# Patient Record
Sex: Female | Born: 1935 | Race: White | Hispanic: No | Smoking: Never smoker
Health system: Southern US, Community
[De-identification: ages and names within clinical notes are randomized; demographics above are authoritative.]

## PROBLEM LIST (undated history)

## (undated) DIAGNOSIS — I2510 Atherosclerotic heart disease of native coronary artery without angina pectoris: Secondary | ICD-10-CM

## (undated) DIAGNOSIS — F0390 Unspecified dementia without behavioral disturbance: Secondary | ICD-10-CM

## (undated) DIAGNOSIS — F32A Depression, unspecified: Secondary | ICD-10-CM

## (undated) DIAGNOSIS — E119 Type 2 diabetes mellitus without complications: Secondary | ICD-10-CM

## (undated) DIAGNOSIS — I739 Peripheral vascular disease, unspecified: Secondary | ICD-10-CM

## (undated) DIAGNOSIS — F329 Major depressive disorder, single episode, unspecified: Secondary | ICD-10-CM

## (undated) DIAGNOSIS — E785 Hyperlipidemia, unspecified: Secondary | ICD-10-CM

## (undated) HISTORY — PX: PERCUTANEOUS CORONARY STENT INTERVENTION (PCI-S): SHX6016

---

## 1988-01-28 HISTORY — PX: EXPLORATORY LAPAROTOMY: SUR591

## 2015-05-15 ENCOUNTER — Encounter: Payer: Self-pay | Admitting: *Deleted

## 2015-05-15 ENCOUNTER — Emergency Department
Admission: EM | Admit: 2015-05-15 | Discharge: 2015-05-15 | Disposition: A | Discharge status: Home / Self Care | Payer: Medicare Other | Attending: Emergency Medicine | Admitting: Emergency Medicine

## 2015-05-15 ENCOUNTER — Emergency Department: Payer: Medicare Other

## 2015-05-15 DIAGNOSIS — Y939 Activity, unspecified: Secondary | ICD-10-CM | POA: Insufficient documentation

## 2015-05-15 DIAGNOSIS — W19XXXA Unspecified fall, initial encounter: Secondary | ICD-10-CM

## 2015-05-15 DIAGNOSIS — T148XXA Other injury of unspecified body region, initial encounter: Secondary | ICD-10-CM

## 2015-05-15 DIAGNOSIS — S0990XA Unspecified injury of head, initial encounter: Secondary | ICD-10-CM | POA: Diagnosis present

## 2015-05-15 DIAGNOSIS — Y9289 Other specified places as the place of occurrence of the external cause: Secondary | ICD-10-CM | POA: Insufficient documentation

## 2015-05-15 DIAGNOSIS — Y999 Unspecified external cause status: Secondary | ICD-10-CM | POA: Diagnosis not present

## 2015-05-15 DIAGNOSIS — W01198A Fall on same level from slipping, tripping and stumbling with subsequent striking against other object, initial encounter: Secondary | ICD-10-CM | POA: Diagnosis not present

## 2015-05-15 DIAGNOSIS — S0083XA Contusion of other part of head, initial encounter: Secondary | ICD-10-CM | POA: Insufficient documentation

## 2015-05-15 NOTE — ED Provider Notes (Addendum)
[facility] Emergency Department Provider Note  Time seen: 9:48 PM  I have reviewed the triage vital signs and the nursing notes.   HISTORY  Chief Complaint Fall and Head Injury    HPI Kelli Valenzuela is a 82 y.o. female with a past medical history of diabetes, hypertension, dementia, who [facility]. According to the patient she states she fell forward hitting her head. Denies LOC. Patient has a large hematoma to right forehead. States mild headache but denies any other complaints. Patient does have a history of dementia which limits the history.Patient is on Plavix per record review.     No past medical history on file.  There are no active problems to display for this patient.   No past surgical history on file.  No current outpatient prescriptions on file.  Allergies Review of patient's allergies indicates no known allergies.  No family history on file.  Social History Social History  Substance Use Topics  . Smoking status: Never Smoker   . Smokeless tobacco: Not on file  . Alcohol Use: No    Review of Systems Constitutional: Negative for fever. Cardiovascular: Negative for chest pain. Respiratory: Negative for shortness of breath. Gastrointestinal: Negative for abdominal pain Musculoskeletal: Negative for back pain.Negative for arm or leg pain negative for neck pain Skin: Hematoma/ecchymosis to right forehead Neurological: Slight headache. Denies weakness or numbness. 10-point ROS otherwise negative.  ____________________________________________   PHYSICAL EXAM:  VITAL SIGNS: ED Triage Vitals  Enc Vitals Group     BP 05/15/15 1945 180/83 mmHg     Pulse Rate 05/15/15 1945 66     Resp 05/15/15 1945 20     Temp 05/15/15 1945 98.2 F (36.8 C)     Temp Source 05/15/15 1945 Oral     SpO2 05/15/15 1945 99 %     Weight 05/15/15 1945 140 lb (63.504 kg)     Height 05/15/15 1945 5'  3" (1.6 m)     Head Cir --      Peak Flow --      Pain Score 05/15/15 2014 0     Pain Loc --      Pain Edu? --      Excl. in GC? --     Constitutional: Alert.  Well appearing and in no distress. Eyes: Normal exam ENT   Head:Moderate size hematoma to right forehead. No laceration. Neck/C-spine is nontender to palpation.   Mouth/Throat: Mucous membranes are moist. Cardiovascular: Normal rate, regular rhythm.  Respiratory: Normal respiratory effort without tachypnea nor retractions. Breath sounds are clear Gastrointestinal: Soft and nontender. No distention. Musculoskeletal: Nontender with normal range of motion in all extremities. Good range of motion in all extremities.  Neurologic:  Normal speech and language. No gross focal neurologic deficits Skin:  Skin is warm, dry and intact. Hematoma to the right forehead as noted above. Psychiatric: Mood and affect are normal. Speech and behavior are normal.   ____________________________________________   RADIOLOGY  CT scan shows no acute abnormality.  ____________________________________________    INITIAL IMPRESSION / ASSESSMENT AND PLAN / ED COURSE  Pertinent labs & imaging results that were available during my care of the patient were reviewed by me and considered in my medical decision making (see chart for details).  Patient presents after a fall at her nursing facility. Moderate sized hematoma to right forehead. CT is negative for intracranial abnormality or cervical fracture. Patient's daughter is here with the  patient, patient is calm, pleasant, no complaints at this time. We'll discharge the patient home and to the daughter's care.  ____________________________________________   FINAL CLINICAL IMPRESSION(S) / ED DIAGNOSES  Fall Closed head Injury   Minna Antis, MD 05/15/15 2152  Minna Antis, MD 05/15/15 2153

## 2015-05-15 NOTE — ED Notes (Signed)
Pt to bathroom with NT

## 2015-05-15 NOTE — Discharge Instructions (Signed)
Fall Prevention in Hospitals, Adult °As a hospital patient, your condition and the treatments you receive can increase your risk for falls. Some additional risk factors for falls in a hospital include: °· Being in an unfamiliar environment. °· Being on bed rest. °· Your surgery. °· Taking certain medicines. °· Your tubing requirements, such as intravenous (IV) therapy or catheters. °It is important that you learn how to decrease fall risks while at the hospital. Below are important tips that can help prevent falls. °SAFETY TIPS FOR PREVENTING FALLS °Talk about your risk of falling. °· Ask your health care provider why you are at risk for falling. Is it your medicine, illness, tubing placement, or something else? °· Make a plan with your health care provider to keep you safe from falls. °· Ask your health care provider or pharmacist about side effects of your medicines. Some medicines can make you dizzy or affect your coordination. °Ask for help. °· Ask for help before getting out of bed. You may need to press your call button. °· Ask for assistance in getting safely to the toilet. °· Ask for a walker or cane to be put at your bedside. Ask that most of the side rails on your bed be placed up before your health care provider leaves the room. °· Ask family or friends to sit with you. °· Ask for things that are out of your reach, such as your glasses, hearing aids, telephone, bedside table, or call button. °Follow these tips to avoid falling: °· Stay lying or seated, rather than standing, while waiting for help. °· Wear rubber-soled slippers or shoes whenever you walk in the hospital. °· Avoid quick, sudden movements. °· Change positions slowly. °· Sit on the side of your bed before standing. °· Stand up slowly and wait before you start to walk. °· Let your health care provider know if there is a spill on the floor. °· Pay careful attention to the medical equipment, electrical cords, and tubes around you. °· When you  need help, use your call button by your bed or in the bathroom. Wait for one of your health care providers to help you. °· If you feel dizzy or unsure of your footing, return to bed and wait for assistance. °· Avoid being distracted by the TV, telephone, or another person in your room. °· Do not lean or support yourself on rolling objects, such as IV poles or bedside tables. °  °This information is not intended to replace advice given to you by your health care provider. Make sure you discuss any questions you have with your health care provider. °  °Document Released: 01/11/2000 Document Revised: 02/03/2014 Document Reviewed: 09/21/2011 °Elsevier Interactive Patient Education ©2016 [employer] ° °Head Injury, Adult °You have a head injury. Headaches and throwing up (vomiting) are common after a head injury. It should be easy to wake up from sleeping. Sometimes you must stay in the hospital. Most problems happen within the first 24 hours. Side effects may occur up to 7-10 days after the injury.  °WHAT ARE THE TYPES OF HEAD INJURIES? °Head injuries can be as minor as a bump. Some head injuries can be more severe. More severe head injuries include: °· A jarring injury to the brain (concussion). °· A bruise of the brain (contusion). This mean there is bleeding in the brain that can cause swelling. °· A cracked skull (skull fracture). °· Bleeding in the brain that collects, clots, and forms a bump (hematoma). °WHEN SHOULD I   GET HELP RIGHT AWAY?  °· You are confused or sleepy. °· You cannot be woken up. °· You feel sick to your stomach (nauseous) or keep throwing up (vomiting). °· Your dizziness or unsteadiness is getting worse. °· You have very bad, lasting headaches that are not helped by medicine. Take medicines only as told by your doctor. °· You cannot use your arms or legs like normal. °· You cannot walk. °· You notice changes in the black spots in the center of the colored part of your eye (pupil). °· You have  clear or bloody fluid coming from your nose or ears. °· You have trouble seeing. °During the next 24 hours after the injury, you must stay with someone who can watch you. This person should get help right away (call 911 in the [place] if you start to shake and are not able to control it (have seizures), you pass out, or you are unable to wake up. °HOW CAN I PREVENT A HEAD INJURY IN THE FUTURE? °· Wear seat belts. °· Wear a helmet while bike riding and playing sports like football. °· Stay away from dangerous activities around the house. °WHEN CAN I RETURN TO NORMAL ACTIVITIES AND ATHLETICS? °See your doctor before doing these activities. You should not do normal activities or play contact sports until 1 week after the following symptoms have stopped: °· Headache that does not go away. °· Dizziness. °· Poor attention. °· Confusion. °· Memory problems. °· Sickness to your stomach or throwing up. °· Tiredness. °· Fussiness. °· Bothered by bright lights or loud noises. °· Anxiousness or depression. °· Restless sleep. °MAKE SURE YOU:  °· Understand these instructions. °· Will watch your condition. °· Will get help right away if you are not doing well or get worse. °  °This information is not intended to replace advice given to you by your health care provider. Make sure you discuss any questions you have with your health care provider. °  °Document Released: 12/27/2007 Document Revised: 02/03/2014 Document Reviewed: 09/20/2012 °Elsevier Interactive Patient Education ©2016 Elsevier Inc. ° °

## 2015-05-15 NOTE — ED Notes (Signed)
[facility].  Pt had unwitnessed fall today.  Struck unknown object.  Hematoma to right side of forehead.  Unsure loc.   Pt is on blood thinner.  Pt alert.  Hx dementia.

## 2015-08-08 ENCOUNTER — Emergency Department: Payer: Medicare Other

## 2015-08-08 ENCOUNTER — Emergency Department
Admission: EM | Admit: 2015-08-08 | Discharge: 2015-08-08 | Disposition: A | Discharge status: Home / Self Care | Payer: Medicare Other | Attending: Emergency Medicine | Admitting: Emergency Medicine

## 2015-08-08 DIAGNOSIS — E119 Type 2 diabetes mellitus without complications: Secondary | ICD-10-CM | POA: Diagnosis not present

## 2015-08-08 DIAGNOSIS — I2510 Atherosclerotic heart disease of native coronary artery without angina pectoris: Secondary | ICD-10-CM | POA: Insufficient documentation

## 2015-08-08 DIAGNOSIS — W182XXA Fall in (into) shower or empty bathtub, initial encounter: Secondary | ICD-10-CM | POA: Diagnosis not present

## 2015-08-08 DIAGNOSIS — Y939 Activity, unspecified: Secondary | ICD-10-CM | POA: Diagnosis not present

## 2015-08-08 DIAGNOSIS — Y929 Unspecified place or not applicable: Secondary | ICD-10-CM | POA: Insufficient documentation

## 2015-08-08 DIAGNOSIS — F329 Major depressive disorder, single episode, unspecified: Secondary | ICD-10-CM | POA: Diagnosis not present

## 2015-08-08 DIAGNOSIS — S0990XA Unspecified injury of head, initial encounter: Secondary | ICD-10-CM | POA: Diagnosis present

## 2015-08-08 DIAGNOSIS — Y999 Unspecified external cause status: Secondary | ICD-10-CM | POA: Diagnosis not present

## 2015-08-08 DIAGNOSIS — E785 Hyperlipidemia, unspecified: Secondary | ICD-10-CM | POA: Diagnosis not present

## 2015-08-08 HISTORY — DX: Atherosclerotic heart disease of native coronary artery without angina pectoris: I25.10

## 2015-08-08 HISTORY — DX: Unspecified dementia, unspecified severity, without behavioral disturbance, psychotic disturbance, mood disturbance, and anxiety: F03.90

## 2015-08-08 HISTORY — DX: Major depressive disorder, single episode, unspecified: F32.9

## 2015-08-08 HISTORY — DX: Hyperlipidemia, unspecified: E78.5

## 2015-08-08 HISTORY — DX: Depression, unspecified: F32.A

## 2015-08-08 HISTORY — DX: Type 2 diabetes mellitus without complications: E11.9

## 2015-08-08 NOTE — ED Notes (Signed)
Pt from [facility] via EMS, reports unwitnessed fall in the shower, pt presents with smal hematoma to back of head, pt denies pain and is at her baseline

## 2015-08-08 NOTE — ED Notes (Signed)
Patient transported to X-ray 

## 2015-08-08 NOTE — ED Provider Notes (Signed)
Time Seen: Approximately *20/30 I have reviewed the triage notes  Chief Complaint: Fall   History of Present Illness: Kelli Valenzuela is a 82 y.o. female who presents after a non-syncopal fall in the shower. Patient apparently lost her balance and slipped and landed primarily on her head and her chest. She states she feels fine. She denies any neck, thoracic, lumbar spine pain. She denies any extremity pain. Said she has some mild upper left chest wall pain which occurred after the fall. She denies any syncope or loss of consciousness after a fall.   Past Medical History  Diagnosis Date  . Diabetes mellitus without complication (HCC)   . Coronary artery disease   . Hyperlipidemia   . Depression   . Dementia     There are no active problems to display for this patient.   No past surgical history on file.  No past surgical history on file.  No current outpatient prescriptions on file.  Allergies:  Review of patient's allergies indicates no known allergies.  Family History: No family history on file.  Social History: Social History  Substance Use Topics  . Smoking status: Never Smoker   . Smokeless tobacco: None  . Alcohol Use: No     Review of Systems:   10 point review of systems was performed and was otherwise negative:  Constitutional: No fever Eyes: No visual disturbances ENT: No sore throat, ear pain Cardiac: Mild upper left chest wall pain Respiratory: No shortness of breath, wheezing, or stridor Abdomen: No abdominal pain, no vomiting, No diarrhea Endocrine: No weight loss, No night sweats Extremities: No peripheral edema, cyanosis Skin: No rashes, easy bruising Neurologic: No focal weakness, trouble with speech or swollowing Urologic: No dysuria, Hematuria, or urinary frequency   Physical Exam:  ED Triage Vitals  Enc Vitals Group     BP 08/08/15 1916 123/71 mmHg     Pulse Rate 08/08/15 1916 71     Resp 08/08/15 1916 16     Temp 08/08/15 1916  98.5 F (36.9 C)     Temp src --      SpO2 08/08/15 1916 96 %     Weight --      Height --      Head Cir --      Peak Flow --      Pain Score --      Pain Loc --      Pain Edu? --      Excl. in GC? --     General: Awake , Alert , and Oriented times 3; GCS 15 Head: Normal cephalic , Small hematoma left frontal region. No crepitus or step-off noted Eyes: Pupils equal , round, reactive to light Nose/Throat: No nasal drainage, patent upper airway without erythema or exudate.  Neck: Supple, Full range of motion, No anterior adenopathy or palpable thyroid masses. Good flexion extension or rotation pain or neuropraxia. Lungs: Clear to ascultation without wheezes , rhonchi, or rales Heart: Regular rate, regular rhythm without murmurs , gallops , or rubs Abdomen: Soft, non tender without rebound, guarding , or rigidity; bowel sounds positive and symmetric in all 4 quadrants. No organomegaly .        Extremities: 2 plus symmetric pulses. No edema, clubbing or cyanosis. Full range of motion with no obvious acute trauma. She has a small bruise on the left hand posteriorly she states is old. Neurologic: n, Motor symmetric without deficits, sensory intact Skin: warm, dry, no rashes No reproducible thoracic  or lumbar spine pain.  EKG: * ED ECG REPORT I, Jennye Moccasin, the attending physician, personally viewed and interpreted this ECG.  Date: 08/08/2015 EKG Time: *1953 Rate: 75 Rhythm: normal sinus rhythm QRS Axis: normal Intervals: normal ST/T Wave abnormalities: normal Conduction Disturbances: none Narrative Interpretation: unremarkable Poor R-wave progression anteriorly leads No acute ischemic changes   Radiology:       CT HEAD WO CONTRAST (Final result) Result time: 08/08/15 21:05:10   Final result by Rad Results In Interface (08/08/15 21:05:10)   Narrative:   CLINICAL DATA: Unwitnessed fall in the shower. Hematoma of the back of the head.  EXAM: CT HEAD WITHOUT  CONTRAST  TECHNIQUE: Contiguous axial images were obtained from the base of the skull through the vertex without intravenous contrast.  COMPARISON: 05/15/2015  FINDINGS: Left posterior parietal scalp swelling. No underlying skull fracture. No evidence of intracranial hemorrhage. The brain shows mild generalized atrophy with mild chronic small-vessel disease of the white matter, typical for age. No mass lesion, hydrocephalus or extra-axial collection. Sinuses, middle ears and mastoids are clear. There is atherosclerotic calcification of the major vessels at the base of the brain.  IMPRESSION: No skull fracture. No acute intracranial finding. Ordinary age related atrophy and chronic small vessel disease. Left parietal scalp hematoma.   Electronically Signed By: Paulina Fusi M.D. On: 08/08/2015 21:05          DG Chest 2 View (Final result) Result time: 08/08/15 20:37:41   Final result by Rad Results In Interface (08/08/15 20:37:41)   Narrative:   CLINICAL DATA: Status post fall, trauma  EXAM: CHEST 2 VIEW  COMPARISON: None.  FINDINGS: There is no focal parenchymal opacity. There is no pleural effusion or pneumothorax. The heart and mediastinal contours are unremarkable. There is evidence of prior CABG.  There is left shoulder arthroplasty there is no acute osseous abnormality  IMPRESSION: No active cardiopulmonary disease.   Electronically Signed By: Elige Ko On: 08/08/2015 20:37          I personally reviewed the radiologic studies     ED Course: * Patient's stay here was uneventful and with her head trauma she is at her normal mental status. Patient does not appear to have suffered any significant intracranial injury. She is otherwise stable with no obvious changes in her EKG. Her chest wall pain essentially resolved at this time and she has no crepitus or signs of a pneumothorax. Patient I felt could be discharged back to  assisted living area. She will be discharged there and will be transported by her family.    Assessment:  Acute closed head injury   Final Clinical Impression:  Final diagnoses:  Head trauma, initial encounter     Plan:  Outpatient Please return immediately if condition worsens. Please contact her primary physician or the physician you were given for referral. If you have any specialist physicians involved in her treatment and plan please also contact them. Thank you for using [facility].            Jennye Moccasin, MD 08/08/15 2146

## 2015-08-08 NOTE — Discharge Instructions (Signed)
Head Injury, Adult °You have a head injury. Headaches and throwing up (vomiting) are common after a head injury. It should be easy to wake up from sleeping. Sometimes you must stay in the hospital. Most problems happen within the first 24 hours. Side effects may occur up to 7-10 days after the injury.  °WHAT ARE THE TYPES OF HEAD INJURIES? °Head injuries can be as minor as a bump. Some head injuries can be more severe. More severe head injuries include: °· A jarring injury to the brain (concussion). °· A bruise of the brain (contusion). This mean there is bleeding in the brain that can cause swelling. °· A cracked skull (skull fracture). °· Bleeding in the brain that collects, clots, and forms a bump (hematoma). °WHEN SHOULD I GET HELP RIGHT AWAY?  °· You are confused or sleepy. °· You cannot be woken up. °· You feel sick to your stomach (nauseous) or keep throwing up (vomiting). °· Your dizziness or unsteadiness is getting worse. °· You have very bad, lasting headaches that are not helped by medicine. Take medicines only as told by your doctor. °· You cannot use your arms or legs like normal. °· You cannot walk. °· You notice changes in the black spots in the center of the colored part of your eye (pupil). °· You have clear or bloody fluid coming from your nose or ears. °· You have trouble seeing. °During the next 24 hours after the injury, you must stay with someone who can watch you. This person should get help right away (call 911 in the [place] if you start to shake and are not able to control it (have seizures), you pass out, or you are unable to wake up. °HOW CAN I PREVENT A HEAD INJURY IN THE FUTURE? °· Wear seat belts. °· Wear a helmet while bike riding and playing sports like football. °· Stay away from dangerous activities around the house. °WHEN CAN I RETURN TO NORMAL ACTIVITIES AND ATHLETICS? °See your doctor before doing these activities. You should not do normal activities or play contact sports until 1  week after the following symptoms have stopped: °· Headache that does not go away. °· Dizziness. °· Poor attention. °· Confusion. °· Memory problems. °· Sickness to your stomach or throwing up. °· Tiredness. °· Fussiness. °· Bothered by bright lights or loud noises. °· Anxiousness or depression. °· Restless sleep. °MAKE SURE YOU:  °· Understand these instructions. °· Will watch your condition. °· Will get help right away if you are not doing well or get worse. °  °This information is not intended to replace advice given to you by your health care provider. Make sure you discuss any questions you have with your health care provider. °  °Document Released: 12/27/2007 Document Revised: 02/03/2014 Document Reviewed: 09/20/2012 °Elsevier Interactive Patient Education ©2016 [employer] Inc. ° °Please return immediately if condition worsens. Please contact her primary physician or the physician you were given for referral. If you have any specialist physicians involved in her treatment and plan please also contact them. Thank you for using [facility]. ° °

## 2015-09-07 ENCOUNTER — Observation Stay
Admission: EM | Admit: 2015-09-07 | Discharge: 2015-09-09 | Disposition: A | Discharge status: Skilled Nursing Facility | Payer: Medicare Other | Source: Home / Self Care | Attending: Internal Medicine | Admitting: Internal Medicine

## 2015-09-07 ENCOUNTER — Encounter: Payer: Self-pay | Admitting: Emergency Medicine

## 2015-09-07 DIAGNOSIS — Z8379 Family history of other diseases of the digestive system: Secondary | ICD-10-CM

## 2015-09-07 DIAGNOSIS — F0390 Unspecified dementia without behavioral disturbance: Secondary | ICD-10-CM

## 2015-09-07 DIAGNOSIS — F329 Major depressive disorder, single episode, unspecified: Secondary | ICD-10-CM | POA: Insufficient documentation

## 2015-09-07 DIAGNOSIS — Z8041 Family history of malignant neoplasm of ovary: Secondary | ICD-10-CM | POA: Insufficient documentation

## 2015-09-07 DIAGNOSIS — E11649 Type 2 diabetes mellitus with hypoglycemia without coma: Secondary | ICD-10-CM

## 2015-09-07 DIAGNOSIS — I2510 Atherosclerotic heart disease of native coronary artery without angina pectoris: Secondary | ICD-10-CM

## 2015-09-07 DIAGNOSIS — T383X5A Adverse effect of insulin and oral hypoglycemic [antidiabetic] drugs, initial encounter: Secondary | ICD-10-CM | POA: Insufficient documentation

## 2015-09-07 DIAGNOSIS — Z794 Long term (current) use of insulin: Secondary | ICD-10-CM | POA: Insufficient documentation

## 2015-09-07 DIAGNOSIS — Z882 Allergy status to sulfonamides status: Secondary | ICD-10-CM

## 2015-09-07 DIAGNOSIS — E162 Hypoglycemia, unspecified: Secondary | ICD-10-CM | POA: Diagnosis present

## 2015-09-07 DIAGNOSIS — I252 Old myocardial infarction: Secondary | ICD-10-CM

## 2015-09-07 DIAGNOSIS — I10 Essential (primary) hypertension: Secondary | ICD-10-CM | POA: Insufficient documentation

## 2015-09-07 DIAGNOSIS — E785 Hyperlipidemia, unspecified: Secondary | ICD-10-CM

## 2015-09-07 DIAGNOSIS — Z79899 Other long term (current) drug therapy: Secondary | ICD-10-CM | POA: Insufficient documentation

## 2015-09-07 LAB — GLUCOSE, CAPILLARY
GLUCOSE-CAPILLARY: 117 mg/dL — AB (ref 65–99)
GLUCOSE-CAPILLARY: 166 mg/dL — AB (ref 65–99)

## 2015-09-07 MED ORDER — DEXTROSE-NACL 5-0.9 % IV SOLN
INTRAVENOUS | Status: DC
  Administered 2015-09-07: 23:00:00 via INTRAVENOUS

## 2015-09-07 NOTE — ED Triage Notes (Addendum)
[facility] to [facility], EMS report pt BG 40 upon arrival, d50 given and BG up to 363.  Per facility, pt's BG normally elevated in 200-300s.  Pt baseline dementia.  Pt NAD upon arrival, clothes were wet from diaphoresis, removed and pt placed in clean gown given warm blanket

## 2015-09-08 ENCOUNTER — Encounter: Payer: Self-pay | Admitting: Internal Medicine

## 2015-09-08 DIAGNOSIS — E162 Hypoglycemia, unspecified: Secondary | ICD-10-CM | POA: Diagnosis present

## 2015-09-08 LAB — GLUCOSE, CAPILLARY
GLUCOSE-CAPILLARY: 143 mg/dL — AB (ref 65–99)
Glucose-Capillary: 123 mg/dL — ABNORMAL HIGH (ref 65–99)
Glucose-Capillary: 127 mg/dL — ABNORMAL HIGH (ref 65–99)
Glucose-Capillary: 164 mg/dL — ABNORMAL HIGH (ref 65–99)
Glucose-Capillary: 180 mg/dL — ABNORMAL HIGH (ref 65–99)
Glucose-Capillary: 186 mg/dL — ABNORMAL HIGH (ref 65–99)
Glucose-Capillary: 188 mg/dL — ABNORMAL HIGH (ref 65–99)
Glucose-Capillary: 189 mg/dL — ABNORMAL HIGH (ref 65–99)
Glucose-Capillary: 218 mg/dL — ABNORMAL HIGH (ref 65–99)
Glucose-Capillary: 231 mg/dL — ABNORMAL HIGH (ref 65–99)
Glucose-Capillary: 59 mg/dL — ABNORMAL LOW (ref 65–99)
Glucose-Capillary: 60 mg/dL — ABNORMAL LOW (ref 65–99)
Glucose-Capillary: 73 mg/dL (ref 65–99)

## 2015-09-08 LAB — CBC
HEMATOCRIT: 36.8 % (ref 35.0–47.0)
HEMOGLOBIN: 12.6 g/dL (ref 12.0–16.0)
MCH: 30.2 pg (ref 26.0–34.0)
MCHC: 34.4 g/dL (ref 32.0–36.0)
MCV: 87.8 fL (ref 80.0–100.0)
Platelets: 234 10*3/uL (ref 150–440)
RBC: 4.19 MIL/uL (ref 3.80–5.20)
RDW: 14.4 % (ref 11.5–14.5)
WBC: 11.4 10*3/uL — ABNORMAL HIGH (ref 3.6–11.0)

## 2015-09-08 LAB — COMPREHENSIVE METABOLIC PANEL
ALBUMIN: 4.1 g/dL (ref 3.5–5.0)
ALK PHOS: 142 U/L — AB (ref 38–126)
ALT: 9 U/L — ABNORMAL LOW (ref 14–54)
ANION GAP: 10 (ref 5–15)
AST: 14 U/L — ABNORMAL LOW (ref 15–41)
BILIRUBIN TOTAL: 0.5 mg/dL (ref 0.3–1.2)
BUN: 33 mg/dL — ABNORMAL HIGH (ref 6–20)
CALCIUM: 9.5 mg/dL (ref 8.9–10.3)
CO2: 28 mmol/L (ref 22–32)
Chloride: 102 mmol/L (ref 101–111)
Creatinine, Ser: 0.89 mg/dL (ref 0.44–1.00)
GFR calc Af Amer: >60 mL/min (ref >60)
GFR, EST NON AFRICAN AMERICAN: 57 mL/min — AB (ref >60)
GLUCOSE: 115 mg/dL — AB (ref 65–99)
POTASSIUM: 4.2 mmol/L (ref 3.5–5.1)
Sodium: 140 mmol/L (ref 135–145)
TOTAL PROTEIN: 7.3 g/dL (ref 6.5–8.1)

## 2015-09-08 LAB — URINALYSIS COMPLETE WITH MICROSCOPIC (ARMC ONLY)
BILIRUBIN URINE: NEGATIVE
Bacteria, UA: NONE SEEN
GLUCOSE, UA: 50 mg/dL — AB
Hgb urine dipstick: NEGATIVE
KETONES UR: NEGATIVE mg/dL
Leukocytes, UA: NEGATIVE
Nitrite: NEGATIVE
PROTEIN: NEGATIVE mg/dL
SPECIFIC GRAVITY, URINE: 1.02 (ref 1.005–1.030)
SQUAMOUS EPITHELIAL / LPF: NONE SEEN
pH: 5 (ref 5.0–8.0)

## 2015-09-08 LAB — MRSA PCR SCREENING: MRSA by PCR: NEGATIVE

## 2015-09-08 LAB — TSH: TSH: 3.748 u[IU]/mL (ref 0.350–4.500)

## 2015-09-08 LAB — HEMOGLOBIN A1C: Hgb A1c MFr Bld: 7.6 % — ABNORMAL HIGH (ref 4.0–6.0)

## 2015-09-08 MED ORDER — ACETAMINOPHEN 325 MG PO TABS: 650.0000 mg | ORAL_TABLET | Freq: Four times a day (QID) | ORAL | Status: DC | PRN

## 2015-09-08 MED ORDER — VITAMIN B-12 1000 MCG PO TABS
1000.0000 ug | ORAL_TABLET | Freq: Every day | ORAL | Status: DC
  Administered 2015-09-08 – 2015-09-09 (×2): 1000 ug via ORAL
  Filled 2015-09-08 (×2): qty 1

## 2015-09-08 MED ORDER — ONDANSETRON HCL 4 MG/2ML IJ SOLN: 4.0000 mg | Freq: Four times a day (QID) | INTRAMUSCULAR | Status: DC | PRN

## 2015-09-08 MED ORDER — DOCUSATE SODIUM 100 MG PO CAPS
100.0000 mg | ORAL_CAPSULE | Freq: Two times a day (BID) | ORAL | Status: DC
  Administered 2015-09-08 – 2015-09-09 (×3): 100 mg via ORAL
  Filled 2015-09-08 (×3): qty 1

## 2015-09-08 MED ORDER — INSULIN GLARGINE 100 UNIT/ML ~~LOC~~ SOLN
8.0000 [IU] | Freq: Every day | SUBCUTANEOUS | Status: DC
  Filled 2015-09-08: qty 0.08

## 2015-09-08 MED ORDER — ENOXAPARIN SODIUM 40 MG/0.4ML ~~LOC~~ SOLN
40.0000 mg | SUBCUTANEOUS | Status: DC
  Administered 2015-09-08: 40 mg via SUBCUTANEOUS
  Filled 2015-09-08: qty 0.4

## 2015-09-08 MED ORDER — MORPHINE SULFATE (PF) 2 MG/ML IV SOLN: 1.0000 mg | INTRAVENOUS | Status: DC | PRN

## 2015-09-08 MED ORDER — ONDANSETRON HCL 4 MG PO TABS: 4.0000 mg | ORAL_TABLET | Freq: Four times a day (QID) | ORAL | Status: DC | PRN

## 2015-09-08 MED ORDER — BUPROPION HCL 100 MG PO TABS
100.0000 mg | ORAL_TABLET | ORAL | Status: DC
  Administered 2015-09-08: 100 mg via ORAL
  Filled 2015-09-08: qty 1

## 2015-09-08 MED ORDER — DEXTROSE 10 % IV SOLN
INTRAVENOUS | Status: DC
  Administered 2015-09-08: via INTRAVENOUS

## 2015-09-08 MED ORDER — CARVEDILOL 6.25 MG PO TABS
6.2500 mg | ORAL_TABLET | Freq: Two times a day (BID) | ORAL | Status: DC
  Administered 2015-09-08 – 2015-09-09 (×3): 6.25 mg via ORAL
  Filled 2015-09-08 (×3): qty 1

## 2015-09-08 MED ORDER — LISINOPRIL 5 MG PO TABS
2.5000 mg | ORAL_TABLET | Freq: Every day | ORAL | Status: DC
  Administered 2015-09-08 – 2015-09-09 (×2): 2.5 mg via ORAL
  Filled 2015-09-08 (×2): qty 1

## 2015-09-08 MED ORDER — CLOPIDOGREL BISULFATE 75 MG PO TABS
75.0000 mg | ORAL_TABLET | Freq: Every day | ORAL | Status: DC
  Administered 2015-09-08 – 2015-09-09 (×2): 75 mg via ORAL
  Filled 2015-09-08 (×2): qty 1

## 2015-09-08 MED ORDER — ACETAMINOPHEN 650 MG RE SUPP: 650.0000 mg | Freq: Four times a day (QID) | RECTAL | Status: DC | PRN

## 2015-09-08 MED ORDER — INSULIN ASPART 100 UNIT/ML ~~LOC~~ SOLN
0.0000 [IU] | Freq: Three times a day (TID) | SUBCUTANEOUS | Status: DC
  Administered 2015-09-08 (×2): 3 [IU] via SUBCUTANEOUS
  Administered 2015-09-08 – 2015-09-09 (×2): 2 [IU] via SUBCUTANEOUS
  Administered 2015-09-09: 5 [IU] via SUBCUTANEOUS
  Filled 2015-09-08: qty 2
  Filled 2015-09-08 (×2): qty 3
  Filled 2015-09-08: qty 2
  Filled 2015-09-08: qty 5

## 2015-09-08 NOTE — H&P (Signed)
Kelli Valenzuela is an 82 y.o. female.   Chief Complaint: Hypoglycemia HPI: The patient with past medical history significant for dementia and diabetes presents to the emergency department via EMS after being found to have blood sugar approximately 40. The patient was diaphoretic but denied pain. She was given D50 en route and blood sugar increased to 363. However, blood sugar continued to dip. Patient was placed on D5 but then had to be increased to D10 which prompted the emergency department staff to call the hospitalist service for admission.  Past Medical History:  Diagnosis Date  . Coronary artery disease   . Dementia   . Depression   . Diabetes mellitus without complication ([place])   . Hyperlipidemia     Prior to Admission medications   Medication Sig Start Date End Date Taking? Authorizing Provider  buPROPion (WELLBUTRIN) 100 MG tablet Take 100 mg by mouth every other day.   Yes Historical Provider, MD  carvedilol (COREG) 6.25 MG tablet Take 6.25 mg by mouth 2 (two) times daily with a meal.   Yes Historical Provider, MD  clopidogrel (PLAVIX) 75 MG tablet Take 75 mg by mouth daily.   Yes Historical Provider, MD  insulin aspart (NOVOLOG) 100 UNIT/ML injection Inject 10 Units into the skin 2 (two) times daily with a meal. If BS less than 80   Yes Historical Provider, MD  insulin glargine (LANTUS) 100 UNIT/ML injection Inject 14 Units into the skin daily. At 0900 if BS less than 70   Yes Historical Provider, MD  lisinopril (PRINIVIL,ZESTRIL) 2.5 MG tablet Take 2.5 mg by mouth daily.   Yes Historical Provider, MD  meloxicam (MOBIC) 7.5 MG tablet Take 7.5 mg by mouth daily.   Yes Historical Provider, MD  metFORMIN (GLUCOPHAGE) 500 MG tablet Take 1,000 mg by mouth daily with breakfast.   Yes Historical Provider, MD  metFORMIN (GLUCOPHAGE) 500 MG tablet Take 500 mg by mouth every evening.   Yes Historical Provider, MD  vitamin B-12 (CYANOCOBALAMIN) 1000 MCG tablet Take 1,000 mcg by mouth daily.    Yes Historical Provider, MD     Family History  Problem Relation Age of Onset  . Ovarian cancer Mother   . Esophageal varices Father    Social History:  reports that she has never smoked. She has never used smokeless tobacco. She reports that she does not drink alcohol. Her drug history is not on file.  Allergies:  Allergies  Allergen Reactions  . Sulfa Antibiotics Hives    Medications Prior to Admission  Medication Sig Dispense Refill  . buPROPion (WELLBUTRIN) 100 MG tablet Take 100 mg by mouth every other day.    . carvedilol (COREG) 6.25 MG tablet Take 6.25 mg by mouth 2 (two) times daily with a meal.    . clopidogrel (PLAVIX) 75 MG tablet Take 75 mg by mouth daily.    . insulin aspart (NOVOLOG) 100 UNIT/ML injection Inject 10 Units into the skin 2 (two) times daily with a meal. If BS less than 80    . insulin glargine (LANTUS) 100 UNIT/ML injection Inject 14 Units into the skin daily. At 0900 if BS less than 70    . lisinopril (PRINIVIL,ZESTRIL) 2.5 MG tablet Take 2.5 mg by mouth daily.    . meloxicam (MOBIC) 7.5 MG tablet Take 7.5 mg by mouth daily.    . metFORMIN (GLUCOPHAGE) 500 MG tablet Take 1,000 mg by mouth daily with breakfast.    . metFORMIN (GLUCOPHAGE) 500 MG tablet Take 500 mg by  mouth every evening.    . vitamin B-12 (CYANOCOBALAMIN) 1000 MCG tablet Take 1,000 mcg by mouth daily.      Results for orders placed or performed during the hospital encounter of 09/07/15 (from the past 48 hour(s))  Glucose, capillary     Status: Abnormal   Collection Time: 09/07/15 10:56 PM  Result Value Ref Range   Glucose-Capillary 166 (H) 65 - 99 mg/dL  CBC     Status: Abnormal   Collection Time: 09/07/15 11:17 PM  Result Value Ref Range   WBC 11.4 (H) 3.6 - 11.0 K/uL   RBC 4.19 3.80 - 5.20 MIL/uL   Hemoglobin 12.6 12.0 - 16.0 g/dL   HCT 36.8 35.0 - 47.0 %   MCV 87.8 80.0 - 100.0 fL   MCH 30.2 26.0 - 34.0 pg   MCHC 34.4 32.0 - 36.0 g/dL   RDW 14.4 11.5 - 14.5 %   Platelets  234 150 - 440 K/uL  Comprehensive metabolic panel     Status: Abnormal   Collection Time: 09/07/15 11:17 PM  Result Value Ref Range   Sodium 140 135 - 145 mmol/L   Potassium 4.2 3.5 - 5.1 mmol/L   Chloride 102 101 - 111 mmol/L   CO2 28 22 - 32 mmol/L   Glucose, Bld 115 (H) 65 - 99 mg/dL   BUN 33 (H) 6 - 20 mg/dL   Creatinine, Ser 0.89 0.44 - 1.00 mg/dL   Calcium 9.5 8.9 - 10.3 mg/dL   Total Protein 7.3 6.5 - 8.1 g/dL   Albumin 4.1 3.5 - 5.0 g/dL   AST 14 (L) 15 - 41 U/L   ALT 9 (L) 14 - 54 U/L   Alkaline Phosphatase 142 (H) 38 - 126 U/L   Total Bilirubin 0.5 0.3 - 1.2 mg/dL   GFR calc non Af Amer 57 (L) >60 mL/min   GFR calc Af Amer >60 >60 mL/min    Comment: (NOTE) The eGFR has been calculated using the CKD EPI equation. This calculation has not been validated in all clinical situations. eGFR's persistently <60 mL/min signify possible Chronic Kidney Disease.    Anion gap 10 5 - 15  Glucose, capillary     Status: Abnormal   Collection Time: 09/08/15 12:00 AM  Result Value Ref Range   Glucose-Capillary 117 (H) 65 - 99 mg/dL  Urinalysis complete, with microscopic ([facility] only)     Status: Abnormal   Collection Time: 09/08/15 12:07 AM  Result Value Ref Range   Color, Urine YELLOW (A) YELLOW   APPearance CLEAR (A) CLEAR   Glucose, UA 50 (A) NEGATIVE mg/dL   Bilirubin Urine NEGATIVE NEGATIVE   Ketones, ur NEGATIVE NEGATIVE mg/dL   Specific Gravity, Urine 1.020 1.005 - 1.030   Hgb urine dipstick NEGATIVE NEGATIVE   pH 5.0 5.0 - 8.0   Protein, ur NEGATIVE NEGATIVE mg/dL   Nitrite NEGATIVE NEGATIVE   Leukocytes, UA NEGATIVE NEGATIVE   RBC / HPF 0-5 0 - 5 RBC/hpf   WBC, UA 0-5 0 - 5 WBC/hpf   Bacteria, UA NONE SEEN NONE SEEN   Squamous Epithelial / LPF NONE SEEN NONE SEEN  Glucose, capillary     Status: Abnormal   Collection Time: 09/08/15  1:03 AM  Result Value Ref Range   Glucose-Capillary 143 (H) 65 - 99 mg/dL  Glucose, capillary     Status: Abnormal   Collection Time:  09/08/15  2:04 AM  Result Value Ref Range   Glucose-Capillary 188 (H) 65 -  99 mg/dL   No results found.  Review of Systems  Unable to perform ROS: Dementia    Blood pressure (!) 179/80, pulse 87, temperature 98.2 F (36.8 C), temperature source Oral, resp. rate 17, height _0  (1.626 m), weight 63.6 kg (140 lb 4 oz), SpO2 100 %. Physical Exam  Vitals reviewed. Constitutional: She is oriented to person, place, and time. She appears well-developed and well-nourished.  HENT:  Head: Normocephalic and atraumatic.  Mouth/Throat: Oropharynx is clear and moist.  Eyes: Conjunctivae and EOM are normal. Pupils are equal, round, and reactive to light. No scleral icterus.  Neck: Normal range of motion. Neck supple. No JVD present. No tracheal deviation present. No thyromegaly present.  Cardiovascular: Normal rate, regular rhythm and normal heart sounds.  Exam reveals no gallop and no friction rub.   No murmur heard. Respiratory: Effort normal and breath sounds normal.  GI: Soft. Bowel sounds are normal. She exhibits no distension. There is no tenderness.  Reducible ventral hernia  Musculoskeletal: Normal range of motion. She exhibits no edema.  Lymphadenopathy:    She has no cervical adenopathy.  Neurological: She is alert and oriented to person, place, and time. No cranial nerve deficit. She exhibits normal muscle tone.  Skin: Skin is warm and dry. No rash noted. No erythema.  Psychiatric:  Difficult to assess mental status due to dementia     Assessment/Plan This is an 82 year old female admitted for hypoglycemia. 1. Hypoglycemia: Unclear etiology. I have held the patient's metformin and her long-acting insulin to restart tomorrow night. I place her on a regular diet and we will continue dextrose supplementation until sugars have normalized. 2. Diabetes mellitus type 2: The patient's Lantus dose was recently increased from 10-14 units. Is unclear if her appetite has decreased or if this  dose is too large. In order to avoid overlap of Lantus dosing but will likely occur if the patient is discharged back to the nursing home in the morning I have scheduled her Lantus dose to restart tomorrow at a reduced dose. I place her on a sensitive sliding scale insulin for now. 3. CAD: Stable. Continue Plavix. 4. Essential hypertension: Stable; continue Coreg and lisinopril 5. Depression: Continue bupropion 6. DVT prophylaxis: Lovenox 7. GI prophylaxis: None The patient is a full code. Time spent on admission orders and patient care approximately 45 minutes  Harrie Foreman, MD 09/08/2015, 3:22 AM

## 2015-09-08 NOTE — ED Provider Notes (Signed)
[facility] Emergency Department Provider Note  ____________________________________________   First MD Initiated Contact with Patient 09/08/15 0000     (approximate)  I have reviewed the triage vital signs and the nursing notes.   HISTORY  Chief Complaint Hypoglycemia    HPI Kelli Valenzuela is a 82 y.o. female resents via EMS from nursing facility with hypoglycemia. Per EMS patient's glucose was 40 upon their arrival he 50 was administered with repeat blood glucose 363. Patient's daughter at bedside states that the patient's glucose is been elevated in the 2-300 range and a such Lantus dose was increased Wednesday from 10-14 units. Patient's daughter states to her knowledge of the patient's by mouth intake as been appropriate. No recent illness   Past Medical History:  Diagnosis Date  . Coronary artery disease   . Dementia   . Depression   . Diabetes mellitus without complication ([facility])   . Hyperlipidemia     There are no active problems to display for this patient.   History reviewed. No pertinent surgical history.  Prior to Admission medications   Medication Sig Start Date End Date Taking? Authorizing Provider  buPROPion (WELLBUTRIN) 100 MG tablet Take 100 mg by mouth every other day.   Yes Historical Provider, MD  carvedilol (COREG) 6.25 MG tablet Take 6.25 mg by mouth 2 (two) times daily with a meal.   Yes Historical Provider, MD  clopidogrel (PLAVIX) 75 MG tablet Take 75 mg by mouth daily.   Yes Historical Provider, MD  insulin aspart (NOVOLOG) 100 UNIT/ML injection Inject 10 Units into the skin 2 (two) times daily with a meal. If BS less than 80   Yes Historical Provider, MD  insulin glargine (LANTUS) 100 UNIT/ML injection Inject 14 Units into the skin daily. At 0900 if BS less than 70   Yes Historical Provider, MD  lisinopril (PRINIVIL,ZESTRIL) 2.5 MG tablet Take 2.5 mg by mouth daily.   Yes Historical Provider, MD  meloxicam (MOBIC) 7.5 MG  tablet Take 7.5 mg by mouth daily.   Yes Historical Provider, MD  metFORMIN (GLUCOPHAGE) 500 MG tablet Take 1,000 mg by mouth daily with breakfast.   Yes Historical Provider, MD  metFORMIN (GLUCOPHAGE) 500 MG tablet Take 500 mg by mouth every evening.   Yes Historical Provider, MD  vitamin B-12 (CYANOCOBALAMIN) 1000 MCG tablet Take 1,000 mcg by mouth daily.   Yes Historical Provider, MD    Allergies No Known drug allergies History reviewed. No pertinent family history.  Social History Social History  Substance Use Topics  . Smoking status: Never Smoker  . Smokeless tobacco: Never Used  . Alcohol use No    Review of Systems Constitutional: No fever/chills Eyes: No visual changes. ENT: No sore throat. Cardiovascular: Denies chest pain. Respiratory: Denies shortness of breath. Gastrointestinal: No abdominal pain.  No nausea, no vomiting.  No diarrhea.  No constipation. Genitourinary: Negative for dysuria. Musculoskeletal: Negative for back pain. Skin: Negative for rash. Neurological: Negative for headaches, focal weakness or numbness.  10-point ROS otherwise negative.  ____________________________________________   PHYSICAL EXAM:  VITAL SIGNS: ED Triage Vitals  Enc Vitals Group     BP 09/07/15 2239 (!) 142/99     Pulse Rate 09/07/15 2239 62     Resp 09/07/15 2239 16     Temp 09/07/15 2239 97 F (36.1 C)     Temp Source 09/07/15 2239 Oral     SpO2 09/07/15 2239 100 %     Weight 09/07/15 2241 142 lb  8 oz (64.6 kg)     Height 09/07/15 2241  (1.626 m)     Head Circumference --      Peak Flow --      Pain Score --      Pain Loc --      Pain Edu? --      Excl. in GC? --     Constitutional: Alert , Well appearing and in no acute distress. Eyes: Conjunctivae are normal. PERRL. EOMI. Head: Atraumatic. Nose: No congestion/rhinnorhea. Mouth/Throat: Mucous membranes are moist.  Oropharynx non-erythematous. Neck: No stridor.  No meningeal signs.   Cardiovascular:  Normal rate, regular rhythm. Good peripheral circulation. Grossly normal heart sounds.   Respiratory: Normal respiratory effort.  No retractions. Lungs CTAB. Gastrointestinal: Soft and nontender. No distention.  Genitourinary: Unremarkable no evidence of any decubitus ulcers on the sacral area Musculoskeletal: No lower extremity tenderness nor edema. No gross deformities of extremities. Neurologic:  Normal speech and language. No gross focal neurologic deficits are appreciated.  Skin:  Skin is warm, dry and intact. No rash noted. .  ____________________________________________   LABS (all labs ordered are listed, but only abnormal results are displayed)  Labs Reviewed  GLUCOSE, CAPILLARY - Abnormal; Notable for the following:       Result Value   Glucose-Capillary 166 (*)    All other components within normal limits  GLUCOSE, CAPILLARY - Abnormal; Notable for the following:    Glucose-Capillary 117 (*)    All other components within normal limits  URINALYSIS COMPLETEWITH MICROSCOPIC ([facility] ONLY)  CBC  COMPREHENSIVE METABOLIC PANEL   ____________________________________________    RADIOLOGY I, [place] BR[place], personally viewed and evaluated these images (plain radiographs) as part of my medical decision making, as well as reviewing the written report by the radiologist.  No results found.  ____________________________________________   PROCEDURES  Procedure(s) performed:   Procedures      INITIAL IMPRESSION / ASSESSMENT AND PLAN / ED COURSE  Pertinent labs & imaging results that were available during my care of the patient were reviewed by me and considered in my medical decision making (see chart for details).  Despite D50 in route by EMS D5 normal saline running at 100 miles an hour patient glucose decreased to 115. As such D10 normal saline started at 100 ML's an hour. Given concern for continued hypoglycemic episodes patient discussed with Dr. Sheryle Hail for  hospital admission further evaluation and management  Clinical Course    ____________________________________________  FINAL CLINICAL IMPRESSION(S) / ED DIAGNOSES  Final diagnoses:  None     MEDICATIONS GIVEN DURING THIS VISIT:  Medications  dextrose 5 %-0.9 % sodium chloride infusion ( Intravenous Stopped 09/08/15 0005)  dextrose 10 % infusion ( Intravenous New Bag/Given 09/08/15 0006)     NEW OUTPATIENT MEDICATIONS STARTED DURING THIS VISIT:  New Prescriptions   No medications on file      Note:  This document was prepared using Dragon voice recognition software and may include unintentional dictation errors.    Darci Current, MD 09/08/15 [phone]

## 2015-09-08 NOTE — Progress Notes (Signed)
Patient care assumed at [facility]07am. Patient alert but confused. VS stable. FSBS stable - only needed insulin coverage with small amounts of insulin. Patient had increased confusion and restlessness as afternoon progressed. Bed alarm maintained as patient tried to get up multiple times. Patient up to bathroom and bedside commode several times, voiding small amounts. Eating and drinking small amounts. Family at bedside at present. No distress noted during the day.

## 2015-09-08 NOTE — Progress Notes (Signed)
[employer] [facility] [place] [facility]   PATIENT NAME: Kelli Valenzuela    MR#:  [id number]  DATE OF BIRTH:  [birth year]  SUBJECTIVE:   patient here with hypoglycemia  Apparently outpatient diabetic medications were increased Recently.  REVIEW OF SYSTEMS:    Review of Systems  Constitutional: Negative.  Negative for chills, fever and malaise/fatigue.  HENT: Negative.  Negative for ear discharge, ear pain, hearing loss, nosebleeds and sore throat.   Eyes: Negative.  Negative for blurred vision and pain.  Respiratory: Negative.  Negative for cough, hemoptysis, shortness of breath and wheezing.   Cardiovascular: Negative.  Negative for chest pain, palpitations and leg swelling.  Gastrointestinal: Negative.  Negative for abdominal pain, blood in stool, diarrhea, nausea and vomiting.  Genitourinary: Negative.  Negative for dysuria.  Musculoskeletal: Negative.  Negative for back pain.  Skin: Negative.   Neurological: Negative for dizziness, tremors, speech change, focal weakness, seizures and headaches.  Endo/Heme/Allergies: Negative.  Does not bruise/bleed easily.  Psychiatric/Behavioral: Positive for memory loss. Negative for depression, hallucinations and suicidal ideas.    Tolerating Diet: yes      DRUG ALLERGIES:   Allergies  Allergen Reactions  . Sulfa Antibiotics Hives    VITALS:  Blood pressure (!) 158/70, pulse 81, temperature 98.2 F (36.8 C), temperature source Oral, resp. rate 20, height  (1.626 m), weight 63.6 kg (140 lb 4 oz), SpO2 100 %.  PHYSICAL EXAMINATION:   Physical Exam  Constitutional: She is well-developed, well-nourished, and in no distress. No distress.  HENT:  Head: Normocephalic.  Eyes: No scleral icterus.  Neck: Normal range of motion. Neck supple. No JVD present. No tracheal deviation present.  Cardiovascular: Normal rate, regular rhythm and normal heart sounds.  Exam reveals no gallop and no friction rub.   No murmur  heard. Pulmonary/Chest: Effort normal and breath sounds normal. No respiratory distress. She has no wheezes. She has no rales. She exhibits no tenderness.  Abdominal: Soft. Bowel sounds are normal. She exhibits no distension and no mass. There is no tenderness. There is no rebound and no guarding.  Musculoskeletal: Normal range of motion. She exhibits no edema.  Neurological: She is alert.  Skin: Skin is warm. No rash noted. No erythema.      LABORATORY PANEL:   CBC  Recent Labs Lab 09/07/15 2317  WBC 11.4*  HGB 12.6  HCT 36.8  PLT 234   ------------------------------------------------------------------------------------------------------------------  Chemistries   Recent Labs Lab 09/07/15 2317  NA 140  K 4.2  CL 102  CO2 28  GLUCOSE 115*  BUN 33*  CREATININE 0.89  CALCIUM 9.5  AST 14*  ALT 9*  ALKPHOS 142*  BILITOT 0.5   ------------------------------------------------------------------------------------------------------------------  Cardiac Enzymes No results for input(s): TROPONINI in the last 168 hours. ------------------------------------------------------------------------------------------------------------------  RADIOLOGY:  No results found.   ASSESSMENT AND PLAN:   82 year old female with advanced dementia and diabetes who presents with symptomatic hypoglycemia.  1. Hypoglycemia: I will try patient off of D10 and monitor blood sugars every 2 hours. Hold outpatient regimen.  At discharge her insulin should be decreased likely to 11 units.  2. Essential hypertension: Continue Coreg and lisinopril.  3. Advanced dementia: Patient is a resident at [facility].  4. Hyperlipidemia: Continue statin.  5. History of ASCVD: Continue Coreg, statin and lisinopril.    Management plans discussed with the patient and son and he is in agreement.  CODE STATUS: full  TOTAL TIME TAKING CARE OF THIS PATIENT: 30 minutes.  POSSIBLE D/C tomorrow,  DEPENDING ON CLINICAL CONDITION.   Xayvion Shirah M.D on 09/08/2015 at 11:52 AM  Between 7am to 6pm - Pager - [phone] After 6pm go to [email or web address] - password [email or web address]  [facility]  Office  [phone]  CC: Primary care physician; Pcp Not In System  Note: This dictation was prepared with Dragon dictation along with smaller phrase technology. Any transcriptional errors that result from this process are unintentional.

## 2015-09-09 LAB — BASIC METABOLIC PANEL
ANION GAP: 8 (ref 5–15)
BUN: 19 mg/dL (ref 6–20)
CO2: 26 mmol/L (ref 22–32)
Calcium: 9.1 mg/dL (ref 8.9–10.3)
Chloride: 104 mmol/L (ref 101–111)
Creatinine, Ser: 0.58 mg/dL (ref 0.44–1.00)
GFR calc Af Amer: >60 mL/min (ref >60)
Glucose, Bld: 166 mg/dL — ABNORMAL HIGH (ref 65–99)
POTASSIUM: 4.2 mmol/L (ref 3.5–5.1)
SODIUM: 138 mmol/L (ref 135–145)

## 2015-09-09 LAB — GLUCOSE, CAPILLARY
Glucose-Capillary: 170 mg/dL — ABNORMAL HIGH (ref 65–99)
Glucose-Capillary: 158 mg/dL — ABNORMAL HIGH (ref 65–99)
Glucose-Capillary: 150 mg/dL — ABNORMAL HIGH (ref 65–99)
Glucose-Capillary: 183 mg/dL — ABNORMAL HIGH (ref 65–99)
Glucose-Capillary: 287 mg/dL — ABNORMAL HIGH (ref 65–99)

## 2015-09-09 MED ORDER — INSULIN ASPART 100 UNIT/ML ~~LOC~~ SOLN: SUBCUTANEOUS | 11 refills | Status: DC

## 2015-09-09 MED ORDER — FLEET ENEMA 7-19 GM/118ML RE ENEM
1.0000 | ENEMA | Freq: Once | RECTAL | Status: AC
  Administered 2015-09-09: 12:00:00 1 via RECTAL

## 2015-09-09 MED ORDER — INSULIN GLARGINE 100 UNIT/ML ~~LOC~~ SOLN: 10.0000 [IU] | Freq: Every day | SUBCUTANEOUS | 11 refills | Status: DC

## 2015-09-09 NOTE — Progress Notes (Addendum)
[facility]. Discharge packet and AVS  given and explained to pt's daughter. Pt's daughter verbalized understanding. Pt's daughter to transport pt to facility and instructed to give discharge packet to facility staff. Report given to Delice Bisonara, med tech. Notified Delice Bisonara that pt has new orders of Lantus and Novolog sliding scale. Delice Bisonara acknowledged report with no questions at this time.

## 2015-09-09 NOTE — Clinical Social Work Note (Signed)
Clinical Social Work Assessment  Patient Details  Name: Kelli Valenzuela MRN: [id number] Date of Birth: 06/30/1925  Date of referral:  09/09/15               Reason for consult:  Facility Placement                Permission sought to share information with:  Family Supports Permission granted to share information::  Yes, Release of Information Signed  Name::     Kelli SakaiMary Ann Valenzuela  Agency::     Relationship::  Daughter  Contact Information:  [phone]  Housing/Transportation Living arrangements for the past 2 months:  Assisted Living Facility Source of Information:  Adult Children Patient Interpreter Needed:  None Criminal Activity/Legal Involvement Pertinent to Current Situation/Hospitalization:  No - Comment as needed Significant Relationships:  Adult Children Lives with:  Facility Resident Do you feel safe going back to the place where you live?  Yes Need for family participation in patient care:  Yes (Comment)  Care giving concerns:  Patient admitted from facility     Social Worker assessment / plan:  Patient has dementia and is currently AMS due to medication past usual baseline of confusion. Patient's daughter was at bedside and provided all information.  Patient recently [facility] ALF level [facility]. [facility] [facility]. Patient admitted due to diabetes medication issues causing low blood glucose and confusion. Patient is baseline confused about time, place and situation. Patient uses adult diapers but is typically continent of bowel and bladder. Patient is able to ambulate with a walker and is independent with feeding. Patient needs limited assistance with bathing and dressing.   Patient to dc back to facility with updated meds. Patient's daughter to transport to the facility as EMS non necessary.  Employment status:  Retired Health and safety inspectornsurance information:  Medicare PT Recommendations:  No Follow Up Information / Referral to community resources:      Patient/Family's Response to care:  Family grateful for care and compliant with changes.  Patient/Family's Understanding of and Emotional Response to Diagnosis, Current Treatment, and Prognosis:  Patient's daughter able to verbalize in her own terms what the changes to the medication regimen are and why, as well as the treatment plan. Patient's daughter thanked CSW for assistance.  Emotional Assessment Appearance:  Appears stated age Attitude/Demeanor/Rapport:   (Patient appropriate/family member pleasant) Affect (typically observed):  Appropriate Orientation:  Oriented to Self (Patient has dementia) Alcohol / Substance use:  Never Used Psych involvement (Current and /or in the community):  No (Comment)  Discharge Needs  Concerns to be addressed:  Discharge Planning Concerns Readmission within the last 30 days:  No Current discharge risk:  Cognitively Impaired Barriers to Discharge:  No Barriers Identified   Judi CongKaren M Jaleen Grupp, LCSW 09/09/2015, 1:59 PM

## 2015-09-09 NOTE — Care Management Obs Status (Signed)
MEDICARE OBSERVATION STATUS NOTIFICATION   Patient Details  Name: Kelli Valenzuela MRN: [id number] Date of Birth: 06/27/1925   Medicare Observation Status Notification Given:  Yes (altered mental status)    Caren MacadamMichelle Dalina Samara, RN 09/09/2015, 9:58 AM

## 2015-09-09 NOTE — Clinical Social Work Note (Signed)
[place] [facility] nurse Hilda LiasMarie contacted 1C to report that sliding scale insulin not able to be administered at the facility. CSW contacted attending for updated dc summary reflecting a change back to original medication. DC summary [facility] by CSW; CSW contacted nurse Hilda LiasMarie by phone to update her. Hilda LiasMarie verbalized understanding and reported that patient would not be "sent back to the hospital".  Argentina PonderKaren Martha Marycatherine Maniscalco, MSW, LCSW-A [phone]

## 2015-09-09 NOTE — Plan of Care (Signed)
Problem: Health Behavior/Discharge Planning: Goal: Ability to manage health-related needs will improve Outcome: Not Progressing Pt is confused, alert to self only.

## 2015-09-09 NOTE — Discharge Summary (Addendum)
[facility] [place] at [facility]   PATIENT NAME: Kelli Valenzuela    MR#:  [id number]  DATE OF BIRTH:  11/25/1925  DATE OF ADMISSION:  09/07/2015 ADMITTING PHYSICIAN: Arnaldo NatalMichael S Diamond, MD  DATE OF DISCHARGE: 09/09/2015  PRIMARY CARE PHYSICIAN: Pcp Not In System    ADMISSION DIAGNOSIS:  [facility] - Diabetic issues  DISCHARGE DIAGNOSIS:  Active Problems:   Hypoglycemia   SECONDARY DIAGNOSIS:   Past Medical History:  Diagnosis Date  . Coronary artery disease    s/p MI  . Dementia   . Depression   . Diabetes mellitus without complication (HCC)    secondary to gallstone pancreatitis and subsequent pancreatitc destruction  . Hyperlipidemia     HOSPITAL COURSE:   82 year old female with advanced dementia and diabetes who presents with symptomatic hypoglycemia.  1. Hypoglycemia:Hypoglycemia as cause due to the fact the patient's insulin was increased from 10-14 units. She was initially started on D10. This was discontinued. Her blood sugars have remained relatively stable off of diabetic medications. I suggest that we stop metformin and decrease Lantus back to 10 units daily. She needs to continue on sliding scale insulin with monitoring of blood sugars every 6 hours. I would allow liberalization of blood sugars given her age and dementia as well as his hospitalization for hypoglycemia.  2. Essential hypertension: Continue Coreg and lisinopril.  3. Advanced dementia: [facility].  4. Hyperlipidemia: Continue statin.  5. History of ASCVD: Continue Coreg, statin and lisinopril  DISCHARGE CONDITIONS AND DIET:   Stable for discharge on diabetic diet  CONSULTS OBTAINED:    DRUG ALLERGIES:   Allergies  Allergen Reactions  . Sulfa Antibiotics Hives    DISCHARGE MEDICATIONS:   Discharge Medication List as of 09/09/2015  1:06 PM    CONTINUE these medications which have CHANGED   Details  insulin glargine (LANTUS) 100 UNIT/ML  injection Inject 0.1 mLs (10 Units total) into the skin at bedtime., Starting Sun 09/09/2015, Normal           CONTINUE these medications which have NOT CHANGED   Details  buPROPion (WELLBUTRIN) 100 MG tablet Take 100 mg by mouth every other day., Historical Med    carvedilol (COREG) 6.25 MG tablet Take 6.25 mg by mouth 2 (two) times daily with a meal., Historical Med    clopidogrel (PLAVIX) 75 MG tablet Take 75 mg by mouth daily., Historical Med    lisinopril (PRINIVIL,ZESTRIL) 2.5 MG tablet Take 2.5 mg by mouth daily., Historical Med    meloxicam (MOBIC) 7.5 MG tablet Take 7.5 mg by mouth daily., Historical Med    vitamin B-12 (CYANOCOBALAMIN) 1000 MCG tablet Take 1,000 mcg by mouth daily., Historical Med      STOP taking these medications     metFORMIN (GLUCOPHAGE) 500 MG tablet      metFORMIN (GLUCOPHAGE) 500 MG tablet     Stop Insulin aspart (NOVOLOG) 100 unit/mL injection - sliding scale coverage         Today   CHIEF COMPLAINT:  Daughter bedside. Patient remains with some confusion on top of her baseline dementia. She did not sleep well last night.   VITAL SIGNS:  Blood pressure 125/74, pulse 83, temperature 98 F (36.7 C), temperature source Oral, resp. rate 18, height 5\' 4"  (1.626 m), weight 61.1 kg (134 lb 12.8 oz), SpO2 97 %.   REVIEW OF SYSTEMS:  Review of Systems  Unable to perform ROS: Dementia  All other systems  reviewed and are negative.    PHYSICAL EXAMINATION:  GENERAL:  82 y.o.-year-old patient lying in the bed with no acute distress.  NECK:  Supple, no jugular venous distention. No thyroid enlargement, no tenderness.  LUNGS: Normal breath sounds bilaterally, no wheezing, rales,rhonchi  No use of accessory muscles of respiration.  CARDIOVASCULAR: S1, S2 normal. No murmurs, rubs, or gallops.  ABDOMEN: Soft, non-tender, non-distended. Bowel sounds present. No organomegaly or mass.  EXTREMITIES: No pedal edema, cyanosis, or clubbing.   PSYCHIATRIC: The patient is alert and oriented x Name  SKIN: No obvious rash, lesion, or ulcer.   DATA REVIEW:   CBC  Recent Labs Lab 09/07/15 2317  WBC 11.4*  HGB 12.6  HCT 36.8  PLT 234    Chemistries   Recent Labs Lab 09/07/15 2317 09/09/15 0440  NA 140 138  K 4.2 4.2  CL 102 104  CO2 28 26  GLUCOSE 115* 166*  BUN 33* 19  CREATININE 0.89 0.58  CALCIUM 9.5 9.1  AST 14*  --   ALT 9*  --   ALKPHOS 142*  --   BILITOT 0.5  --     Cardiac Enzymes No results for input(s): TROPONINI in the last 168 hours.  Microbiology Results  @  RADIOLOGY:  No results found.    Management plans discussed with the patient's daughter and she is in agreement. Stable for discharge [facility]  Patient should follow up with PCP at facility  CODE STATUS:     Code Status Orders        Start     Ordered   09/08/15 0306  Do not attempt resuscitation (DNR)  Continuous    Question Answer Comment  In the event of cardiac or respiratory ARREST Do not call a "code blue"   In the event of cardiac or respiratory ARREST Do not perform Intubation, CPR, defibrillation or ACLS   In the event of cardiac or respiratory ARREST Use medication by any route, position, wound care, and other measures to relive pain and suffering. May use oxygen, suction and manual treatment of airway obstruction as needed for comfort.      09/08/15 0305    Code Status History    Date Active Date Inactive Code Status Order ID Comments User Context   09/08/2015  2:33 AM 09/08/2015  3:05 AM Full Code [id number]  Arnaldo Natal, MD Inpatient    Advance Directive Documentation   Flowsheet Row Most Recent Value  Type of Advance Directive  Out of facility DNR (pink MOST or yellow form)  Pre-existing out of facility DNR order (yellow form or pink MOST form)  Yellow form placed in chart (order not valid for inpatient use)  "MOST" Form in Place?  No data      TOTAL TIME TAKING CARE OF THIS  PATIENT: 36 minutes.    Note: This dictation was prepared with Dragon dictation along with smaller phrase technology. Any transcriptional errors that result from this process are unintentional.  MODY, SITAL M.D on 09/09/2015 at 9:53 AM  Between 7am to 6pm - Pager - [phone] After 6pm go to [email or web address] - Social research officer, government  [facility]  Office  [phone]  CC: Primary care physician; Pcp Not In System   Addendum: I removed the sliding scale insulin from the discharge summary, as nursing facility unable to perform - Marge Duncans MD

## 2015-09-11 ENCOUNTER — Emergency Department: Payer: Medicare Other

## 2015-09-11 ENCOUNTER — Inpatient Hospital Stay
Admission: EM | Admit: 2015-09-11 | Discharge: 2015-09-14 | DRG: 481 | Disposition: A | Discharge status: Skilled Nursing Facility | Payer: Medicare Other | Attending: Internal Medicine | Admitting: Internal Medicine

## 2015-09-11 ENCOUNTER — Inpatient Hospital Stay: Payer: Medicare Other

## 2015-09-11 DIAGNOSIS — Z791 Long term (current) use of non-steroidal anti-inflammatories (NSAID): Secondary | ICD-10-CM | POA: Diagnosis not present

## 2015-09-11 DIAGNOSIS — X58XXXA Exposure to other specified factors, initial encounter: Secondary | ICD-10-CM | POA: Diagnosis present

## 2015-09-11 DIAGNOSIS — I10 Essential (primary) hypertension: Secondary | ICD-10-CM | POA: Diagnosis present

## 2015-09-11 DIAGNOSIS — Z8041 Family history of malignant neoplasm of ovary: Secondary | ICD-10-CM

## 2015-09-11 DIAGNOSIS — S72009A Fracture of unspecified part of neck of unspecified femur, initial encounter for closed fracture: Secondary | ICD-10-CM | POA: Diagnosis present

## 2015-09-11 DIAGNOSIS — S72002A Fracture of unspecified part of neck of left femur, initial encounter for closed fracture: Secondary | ICD-10-CM

## 2015-09-11 DIAGNOSIS — W19XXXA Unspecified fall, initial encounter: Secondary | ICD-10-CM

## 2015-09-11 DIAGNOSIS — I2510 Atherosclerotic heart disease of native coronary artery without angina pectoris: Secondary | ICD-10-CM | POA: Diagnosis present

## 2015-09-11 DIAGNOSIS — F329 Major depressive disorder, single episode, unspecified: Secondary | ICD-10-CM | POA: Diagnosis present

## 2015-09-11 DIAGNOSIS — G8918 Other acute postprocedural pain: Secondary | ICD-10-CM

## 2015-09-11 DIAGNOSIS — Z794 Long term (current) use of insulin: Secondary | ICD-10-CM | POA: Diagnosis not present

## 2015-09-11 DIAGNOSIS — Z7902 Long term (current) use of antithrombotics/antiplatelets: Secondary | ICD-10-CM | POA: Diagnosis not present

## 2015-09-11 DIAGNOSIS — Z951 Presence of aortocoronary bypass graft: Secondary | ICD-10-CM

## 2015-09-11 DIAGNOSIS — Z955 Presence of coronary angioplasty implant and graft: Secondary | ICD-10-CM | POA: Diagnosis not present

## 2015-09-11 DIAGNOSIS — E1165 Type 2 diabetes mellitus with hyperglycemia: Secondary | ICD-10-CM | POA: Diagnosis present

## 2015-09-11 DIAGNOSIS — Z882 Allergy status to sulfonamides status: Secondary | ICD-10-CM

## 2015-09-11 DIAGNOSIS — F0390 Unspecified dementia without behavioral disturbance: Secondary | ICD-10-CM | POA: Diagnosis present

## 2015-09-11 DIAGNOSIS — I252 Old myocardial infarction: Secondary | ICD-10-CM

## 2015-09-11 DIAGNOSIS — Z79899 Other long term (current) drug therapy: Secondary | ICD-10-CM

## 2015-09-11 DIAGNOSIS — E785 Hyperlipidemia, unspecified: Secondary | ICD-10-CM | POA: Diagnosis present

## 2015-09-11 DIAGNOSIS — D649 Anemia, unspecified: Secondary | ICD-10-CM | POA: Diagnosis not present

## 2015-09-11 DIAGNOSIS — S72142A Displaced intertrochanteric fracture of left femur, initial encounter for closed fracture: Principal | ICD-10-CM | POA: Diagnosis present

## 2015-09-11 DIAGNOSIS — Z419 Encounter for procedure for purposes other than remedying health state, unspecified: Secondary | ICD-10-CM

## 2015-09-11 LAB — CBC WITH DIFFERENTIAL/PLATELET
BASOS ABS: 0 10*3/uL (ref 0–0.1)
BASOS PCT: 0 %
Basophils Absolute: 0 10*3/uL (ref 0–0.1)
Basophils Relative: 0 %
EOS ABS: 0 10*3/uL (ref 0–0.7)
Eosinophils Absolute: 0 10*3/uL (ref 0–0.7)
Eosinophils Relative: 0 %
Eosinophils Relative: 0 %
HEMATOCRIT: 33.5 % — AB (ref 35.0–47.0)
HEMATOCRIT: 30.9 % — AB (ref 35.0–47.0)
HEMOGLOBIN: 11.3 g/dL — AB (ref 12.0–16.0)
Hemoglobin: 10.3 g/dL — ABNORMAL LOW (ref 12.0–16.0)
LYMPHS ABS: 0.8 10*3/uL — AB (ref 1.0–3.6)
LYMPHS PCT: 6 %
Lymphocytes Relative: 14 %
Lymphs Abs: 1.6 10*3/uL (ref 1.0–3.6)
MCH: 29.6 pg (ref 26.0–34.0)
MCH: 28.9 pg (ref 26.0–34.0)
MCHC: 33.7 g/dL (ref 32.0–36.0)
MCHC: 33.3 g/dL (ref 32.0–36.0)
MCV: 87.9 fL (ref 80.0–100.0)
MCV: 86.7 fL (ref 80.0–100.0)
MONO ABS: 0.9 10*3/uL (ref 0.2–0.9)
Monocytes Absolute: 0.7 10*3/uL (ref 0.2–0.9)
Monocytes Relative: 5 %
Monocytes Relative: 8 %
NEUTROS ABS: 12.5 10*3/uL — AB (ref 1.4–6.5)
NEUTROS ABS: 9 10*3/uL — AB (ref 1.4–6.5)
NEUTROS PCT: 89 %
Neutrophils Relative %: 78 %
PLATELETS: 253 10*3/uL (ref 150–440)
Platelets: 251 10*3/uL (ref 150–440)
RBC: 3.81 MIL/uL (ref 3.80–5.20)
RBC: 3.56 MIL/uL — ABNORMAL LOW (ref 3.80–5.20)
RDW: 14.3 % (ref 11.5–14.5)
RDW: 14.1 % (ref 11.5–14.5)
WBC: 14 10*3/uL — AB (ref 3.6–11.0)
WBC: 11.5 10*3/uL — ABNORMAL HIGH (ref 3.6–11.0)

## 2015-09-11 LAB — COMPREHENSIVE METABOLIC PANEL
ALBUMIN: 3.9 g/dL (ref 3.5–5.0)
ALT: 14 U/L (ref 14–54)
ALT: 12 U/L — ABNORMAL LOW (ref 14–54)
ANION GAP: 11 (ref 5–15)
AST: 17 U/L (ref 15–41)
AST: 15 U/L (ref 15–41)
Albumin: 3.9 g/dL (ref 3.5–5.0)
Alkaline Phosphatase: 129 U/L — ABNORMAL HIGH (ref 38–126)
Alkaline Phosphatase: 110 U/L (ref 38–126)
Anion gap: 13 (ref 5–15)
BILIRUBIN TOTAL: 0.8 mg/dL (ref 0.3–1.2)
BUN: 37 mg/dL — ABNORMAL HIGH (ref 6–20)
BUN: 42 mg/dL — AB (ref 6–20)
CHLORIDE: 100 mmol/L — AB (ref 101–111)
CHLORIDE: 101 mmol/L (ref 101–111)
CO2: 23 mmol/L (ref 22–32)
CO2: 21 mmol/L — AB (ref 22–32)
Calcium: 9.2 mg/dL (ref 8.9–10.3)
Calcium: 9 mg/dL (ref 8.9–10.3)
Creatinine, Ser: 0.92 mg/dL (ref 0.44–1.00)
Creatinine, Ser: 1.02 mg/dL — ABNORMAL HIGH (ref 0.44–1.00)
GFR calc Af Amer: 56 mL/min — ABNORMAL LOW (ref >60)
GFR calc non Af Amer: 54 mL/min — ABNORMAL LOW (ref >60)
GFR calc non Af Amer: 48 mL/min — ABNORMAL LOW (ref >60)
GLUCOSE: 316 mg/dL — AB (ref 65–99)
Glucose, Bld: 444 mg/dL — ABNORMAL HIGH (ref 65–99)
POTASSIUM: 4.2 mmol/L (ref 3.5–5.1)
Potassium: 5.1 mmol/L (ref 3.5–5.1)
SODIUM: 134 mmol/L — AB (ref 135–145)
SODIUM: 135 mmol/L (ref 135–145)
Total Bilirubin: 0.8 mg/dL (ref 0.3–1.2)
Total Protein: 7 g/dL (ref 6.5–8.1)
Total Protein: 6.7 g/dL (ref 6.5–8.1)

## 2015-09-11 LAB — GLUCOSE, CAPILLARY
GLUCOSE-CAPILLARY: 384 mg/dL — AB (ref 65–99)
GLUCOSE-CAPILLARY: 312 mg/dL — AB (ref 65–99)
GLUCOSE-CAPILLARY: 316 mg/dL — AB (ref 65–99)

## 2015-09-11 LAB — URINALYSIS COMPLETE WITH MICROSCOPIC (ARMC ONLY)
Bacteria, UA: NONE SEEN
Bilirubin Urine: NEGATIVE
Glucose, UA: >500 mg/dL — AB
Hgb urine dipstick: NEGATIVE
Leukocytes, UA: NEGATIVE
Nitrite: NEGATIVE
PROTEIN: NEGATIVE mg/dL
SPECIFIC GRAVITY, URINE: 1.024 (ref 1.005–1.030)
pH: 5 (ref 5.0–8.0)

## 2015-09-11 LAB — PROTIME-INR
INR: 0.98
PROTHROMBIN TIME: 13 s (ref 11.4–15.2)

## 2015-09-11 LAB — APTT: aPTT: 25 seconds (ref 24–36)

## 2015-09-11 LAB — TROPONIN I: Troponin I: <0.03 ng/mL (ref <0.03)

## 2015-09-11 MED ORDER — TRAMADOL HCL 50 MG PO TABS
50.0000 mg | ORAL_TABLET | Freq: Four times a day (QID) | ORAL | Status: DC | PRN
  Administered 2015-09-13 – 2015-09-14 (×2): 50 mg via ORAL
  Filled 2015-09-11 (×2): qty 1

## 2015-09-11 MED ORDER — VITAMIN B-12 1000 MCG PO TABS
1000.0000 ug | ORAL_TABLET | Freq: Every day | ORAL | Status: DC
  Administered 2015-09-13 – 2015-09-14 (×2): 1000 ug via ORAL
  Filled 2015-09-11 (×2): qty 1

## 2015-09-11 MED ORDER — ONDANSETRON HCL 4 MG/2ML IJ SOLN: 4.0000 mg | Freq: Four times a day (QID) | INTRAMUSCULAR | Status: DC | PRN

## 2015-09-11 MED ORDER — INSULIN GLARGINE 100 UNIT/ML ~~LOC~~ SOLN
10.0000 [IU] | Freq: Every day | SUBCUTANEOUS | Status: DC
  Administered 2015-09-11: 10 [IU] via SUBCUTANEOUS
  Filled 2015-09-11 (×2): qty 0.1

## 2015-09-11 MED ORDER — CARVEDILOL 6.25 MG PO TABS
6.2500 mg | ORAL_TABLET | Freq: Two times a day (BID) | ORAL | Status: DC
  Administered 2015-09-12 – 2015-09-14 (×4): 6.25 mg via ORAL
  Filled 2015-09-11 (×5): qty 1

## 2015-09-11 MED ORDER — SODIUM CHLORIDE 0.9 % IV SOLN
INTRAVENOUS | Status: DC
  Administered 2015-09-11 – 2015-09-12 (×3): via INTRAVENOUS

## 2015-09-11 MED ORDER — INSULIN ASPART 100 UNIT/ML ~~LOC~~ SOLN
0.0000 [IU] | Freq: Every day | SUBCUTANEOUS | Status: DC
  Administered 2015-09-11: 4 [IU] via SUBCUTANEOUS
  Filled 2015-09-11: qty 4

## 2015-09-11 MED ORDER — ACETAMINOPHEN 325 MG PO TABS: 650.0000 mg | ORAL_TABLET | Freq: Four times a day (QID) | ORAL | Status: DC | PRN

## 2015-09-11 MED ORDER — INSULIN ASPART 100 UNIT/ML ~~LOC~~ SOLN
0.0000 [IU] | Freq: Three times a day (TID) | SUBCUTANEOUS | Status: DC
  Administered 2015-09-11: 9 [IU] via SUBCUTANEOUS
  Administered 2015-09-12: 7 [IU] via SUBCUTANEOUS
  Filled 2015-09-11: qty 7

## 2015-09-11 MED ORDER — ONDANSETRON HCL 4 MG PO TABS: 4.0000 mg | ORAL_TABLET | Freq: Four times a day (QID) | ORAL | Status: DC | PRN

## 2015-09-11 MED ORDER — BUPROPION HCL 100 MG PO TABS
100.0000 mg | ORAL_TABLET | ORAL | Status: DC
  Administered 2015-09-13: 100 mg via ORAL
  Filled 2015-09-11 (×2): qty 1

## 2015-09-11 MED ORDER — MELOXICAM 7.5 MG PO TABS: 7.5000 mg | ORAL_TABLET | Freq: Every day | ORAL | Status: DC

## 2015-09-11 MED ORDER — ACETAMINOPHEN 650 MG RE SUPP: 650.0000 mg | Freq: Four times a day (QID) | RECTAL | Status: DC | PRN

## 2015-09-11 MED ORDER — INSULIN ASPART 100 UNIT/ML ~~LOC~~ SOLN
SUBCUTANEOUS | Status: AC
  Administered 2015-09-11: 9 [IU] via SUBCUTANEOUS
  Filled 2015-09-11: qty 9

## 2015-09-11 MED ORDER — CEFAZOLIN SODIUM-DEXTROSE 2-4 GM/100ML-% IV SOLN
2.0000 g | INTRAVENOUS | Status: AC
  Administered 2015-09-12: 2 g via INTRAVENOUS
  Filled 2015-09-11: qty 100

## 2015-09-11 NOTE — H&P (Addendum)
[facility] - [place] at [facility]   PATIENT NAME: Kelli Valenzuela    MR#:  [id number]  DATE OF BIRTH:  01/14/1926  DATE OF ADMISSION:  09/11/2015  PRIMARY CARE PHYSICIAN: Drs. making house calls  REQUESTING/REFERRING PHYSICIAN: Dr. Governor Rooksebecca Lord  CHIEF COMPLAINT:   Chief Complaint  Patient presents with  . Weakness    HISTORY OF PRESENT ILLNESS:  Kelli Hatchetnna Preyer  is a 82 y.o. female with a known history of Severe dementia, CAD status post CABG in last stenting was redder than 10 years ago, diabetes mellitus with recent admission to the hospital last week for hypoglycemia presents to the hospital for elevated sugars today and incidentally noted to have left hip fracture. Due to her dementia, patient is unable to provide any history. Most of the history is obtained from patient's daughter and power of attorney at bedside. [facility], and patient was ambulating with a walker. Patient still is able to feed herself. This morning the daughter got a call from assisted living stating that patient was very weak and did not get out of bed. Blood pressure was low normal and her sugars were in the 500 range. So when patient was brought to the hospital she started complaining of left hip pain and x-rays revealed left hip intertrochanteric fracture. No fall has been reported.  PAST MEDICAL HISTORY:   Past Medical History:  Diagnosis Date  . Coronary artery disease    s/p MI  . Dementia   . Depression   . Diabetes mellitus without complication (HCC)    secondary to gallstone pancreatitis and subsequent pancreatitc destruction  . Hyperlipidemia     PAST SURGICAL HISTORY:   Past Surgical History:  Procedure Laterality Date  . EXPLORATORY LAPAROTOMY  1990   multiple  . PERCUTANEOUS CORONARY STENT INTERVENTION (PCI-S)     LAD    SOCIAL HISTORY:   Social History  Substance Use Topics  . Smoking status: Never  Smoker  . Smokeless tobacco: Never Used  . Alcohol use No    FAMILY HISTORY:   Family History  Problem Relation Age of Onset  . Ovarian cancer Mother   . Esophageal varices Father     DRUG ALLERGIES:   Allergies  Allergen Reactions  . Sulfa Antibiotics Hives    REVIEW OF SYSTEMS:   Review of Systems  Unable to perform ROS: Dementia    MEDICATIONS AT HOME:   Prior to Admission medications   Medication Sig Start Date End Date Taking? Authorizing Provider  buPROPion (WELLBUTRIN) 100 MG tablet Take 100 mg by mouth every other day.   Yes Historical Provider, MD  carvedilol (COREG) 6.25 MG tablet Take 6.25 mg by mouth 2 (two) times daily with a meal.   Yes Historical Provider, MD  clopidogrel (PLAVIX) 75 MG tablet Take 75 mg by mouth daily.   Yes Historical Provider, MD  insulin glargine (LANTUS) 100 UNIT/ML injection Inject 0.1 mLs (10 Units total) into the skin at bedtime. 09/09/15  Yes Sital Mody, MD  lisinopril (PRINIVIL,ZESTRIL) 2.5 MG tablet Take 2.5 mg by mouth daily.   Yes Historical Provider, MD  meloxicam (MOBIC) 7.5 MG tablet Take 7.5 mg by mouth daily.   Yes Historical Provider, MD  vitamin B-12 (CYANOCOBALAMIN) 1000 MCG tablet Take 1,000 mcg by mouth daily.   Yes Historical Provider, MD      VITAL SIGNS:  Blood pressure 119/61, pulse 92, temperature 97.2 F (36.2  C), temperature source Oral, resp. rate 20, SpO2 100 %.  PHYSICAL EXAMINATION:   Physical Exam  GENERAL:  82 y.o.-year-old patient lying in the bed with no acute distress.  EYES: Pupils equal, round, reactive to light and accommodation. No scleral icterus. Extraocular muscles intact.  HEENT: Head atraumatic, normocephalic. Oropharynx and nasopharynx clear.  NECK:  Supple, no jugular venous distention. No thyroid enlargement, no tenderness.  LUNGS: Normal breath sounds bilaterally, no wheezing, rales,rhonchi or crepitation. No use of accessory muscles of respiration. Decreased bibasilar breath  sounds CARDIOVASCULAR: S1, S2 normal. No  rubs, or gallops. 3/6 systolic murmur present ABDOMEN: Soft, nontender, nondistended. Bowel sounds present. No organomegaly or mass.  EXTREMITIES: No pedal edema, cyanosis, or clubbing. Left leg is short and abducted and externally rotated NEUROLOGIC: Not following commands, confused, no new focal deficits noted.  PSYCHIATRIC: The patient is alert but not oriented.  SKIN: No obvious rash, lesion, or ulcer.   LABORATORY PANEL:   CBC  Recent Labs Lab 09/11/15 1007  WBC 14.0*  HGB 11.3*  HCT 33.5*  PLT 251   ------------------------------------------------------------------------------------------------------------------  Chemistries   Recent Labs Lab 09/11/15 1007  NA 134*  K 5.1  CL 100*  CO2 23  GLUCOSE 444*  BUN 37*  CREATININE 0.92  CALCIUM 9.2  AST 17  ALT 14  ALKPHOS 129*  BILITOT 0.8   ------------------------------------------------------------------------------------------------------------------  Cardiac Enzymes  Recent Labs Lab 09/11/15 1007  TROPONINI <0.03   ------------------------------------------------------------------------------------------------------------------  RADIOLOGY:  Dg Chest 2 View  Result Date: 09/11/2015 CLINICAL DATA:  Generalized weakness EXAM: CHEST  2 VIEW COMPARISON:  August 08, 2015 FINDINGS: There is no edema or consolidation. The heart size and pulmonary vascularity are normal. No adenopathy. There is atherosclerotic calcification in the aorta. The patient is status post coronary artery bypass grafting. Bones are osteoporotic. There is a total shoulder replacement on the left. There is calcification in the left carotid artery. IMPRESSION: No edema or consolidation. Areas of aortic atherosclerosis. There is also left carotid artery calcification. Bones osteoporotic. Electronically Signed   By: Bretta BangWilliam  Woodruff III M.D.   On: 09/11/2015 11:44   Dg Hip Unilat W Or Wo Pelvis 2-3 Views  Left  Result Date: 09/11/2015 CLINICAL DATA:  Pain with deformity EXAM: DG HIP (WITH OR WITHOUT PELVIS) 2-3V LEFT COMPARISON:  None. FINDINGS: There frontal pelvis as well as frontal and lateral left hip images were obtained. There is a comminuted intertrochanteric femur fracture on the left with varus angulation at the fracture site. There is avulsion of the lesser trochanter medially. No other fractures are evident. There is mild symmetric narrowing both hip joints. Bones are osteoporotic. No dislocation. There is extensive arterial vascular calcification at multiple sites. IMPRESSION: Comminuted intertrochanteric femur fracture on the left with varus angulation fracture slight avulsion lesser trochanter medially. Bones osteoporotic. Mild narrowing both hip joints, symmetric. No dislocation. Extensive arterial vascular calcifications/atherosclerosis. Electronically Signed   By: Bretta BangWilliam  Woodruff III M.D.   On: 09/11/2015 11:45    EKG:   Orders placed or performed during the hospital encounter of 09/11/15  . EKG 12-Lead  . EKG 12-Lead    IMPRESSION AND PLAN:   Kelli Hatchetnna Cabreja  is a 82 y.o. female with a known history of Severe dementia, CAD status post CABG in last stenting was redder than 10 years ago, diabetes mellitus with recent admission to the hospital last week for hypoglycemia presents to the hospital for elevated sugars today and incidentally noted to have left hip fracture.  #  1 left hip fracture-communicative intertrochanteric fracture noted on the left. No fall identified. -Ortho consulted. Patient did take Plavix yesterday -Moderate risk due to her age, history of cardiovascular disease. No recent chest pain or dyspnea according to her daughter. -Her sugars need to be better controlled and blood pressure improved prior to surgery. -Pain control -Physical therapy after the surgery as the goal is to get her up and moving.   #2 diabetes mellitus with hyperglycemia-check A1c. Last week  patient was hypoglycemic with changes in her Lantus. -Restart Lantus at lower dose, sliding scale insulin and monitor sugars closely.  #3 hypertension- hold lisinopril as blood pressure is low normal. Continue Coreg  #4 dementia with depression-continue her albuterol and. Patient seems to be at baseline. Very confused. No agitation or behavioral disturbance. Does have hallucinations at times -Watch for postoperative delirium  #5 CAD status post CABG-several years ago. Has been stable without any recent symptoms of angina, congestive heart failure or arrhythmia. No EKG changes.  #6 DVT prophylaxis-no heparin due to possibility of surgery    All the records are reviewed and case discussed with ED provider. Management plans discussed with the patient, family and they are in agreement.  CODE STATUS: DO NOT RESUSCITATE  TOTAL TIME TAKING CARE OF THIS PATIENT: 50 minutes.    Enid Baas M.D on 09/11/2015 at 4:19 PM  Between 7am to 6pm - Pager - [phone]  After 6pm go to [email or web address] - password [employer]  [facility]  Office  [phone]  CC: Primary care physician; Pcp Not In System

## 2015-09-11 NOTE — ED Notes (Signed)
Attempted to call report x 1  

## 2015-09-11 NOTE — ED Notes (Signed)
Will hold on orthostatic vitals until xray results are back.

## 2015-09-11 NOTE — ED Notes (Signed)
Patient in CT at this time. Will transport once patient is back to her room.

## 2015-09-11 NOTE — ED Notes (Signed)
Soiled brief changed at this time. 

## 2015-09-11 NOTE — ED Triage Notes (Signed)
[facility] with c/o generalized weakness and just not feeling well..Marland Kitchen

## 2015-09-11 NOTE — Consult Note (Signed)
ORTHOPAEDIC CONSULTATION  REQUESTING PHYSICIAN: Enid Baasadhika Kalisetti, MD  Chief Complaint: Left hip pain status post fall  HPI: Kelli Valenzuela is a 82 y.o. female with dementia who is admitted for hyperglycemia and hip pain status post fall. Patient's family provides the history. [facility] and did not have a witnessed fall. She was sent to the hospital due to the patient unable to get out of bed. She had a blood sugar in the 400s on arrival to [facility]. The ER staff noted shortening and external rotation of the left lower extremity. X-rays confirmed an intertrochanteric hip fracture. Orthopedics is consulted for management of her hip fracture.  Past Medical History:  Diagnosis Date  . Coronary artery disease    s/p MI  . Dementia   . Depression   . Diabetes mellitus without complication (HCC)    secondary to gallstone pancreatitis and subsequent pancreatitc destruction  . Hyperlipidemia    Past Surgical History:  Procedure Laterality Date  . EXPLORATORY LAPAROTOMY  1990   multiple  . PERCUTANEOUS CORONARY STENT INTERVENTION (PCI-S)     LAD   Social History   Social History  . Marital status: Widowed    Spouse name: N/A  . Number of children: N/A  . Years of education: N/A   Social History Main Topics  . Smoking status: Never Smoker  . Smokeless tobacco: Never Used  . Alcohol use No  . Drug use: Unknown  . Sexual activity: Not Asked   Other Topics Concern  . None   Social History Narrative  . None   Family History  Problem Relation Age of Onset  . Ovarian cancer Mother   . Esophageal varices Father    Allergies  Allergen Reactions  . Sulfa Antibiotics Hives   Prior to Admission medications   Medication Sig Start Date End Date Taking? Authorizing Provider  buPROPion (WELLBUTRIN) 100 MG tablet Take 100 mg by mouth every other day.   Yes Historical Provider, MD  carvedilol (COREG) 6.25 MG tablet Take 6.25 mg by mouth 2  (two) times daily with a meal.   Yes Historical Provider, MD  clopidogrel (PLAVIX) 75 MG tablet Take 75 mg by mouth daily.   Yes Historical Provider, MD  insulin glargine (LANTUS) 100 UNIT/ML injection Inject 0.1 mLs (10 Units total) into the skin at bedtime. 09/09/15  Yes Sital Mody, MD  lisinopril (PRINIVIL,ZESTRIL) 2.5 MG tablet Take 2.5 mg by mouth daily.   Yes Historical Provider, MD  meloxicam (MOBIC) 7.5 MG tablet Take 7.5 mg by mouth daily.   Yes Historical Provider, MD  vitamin B-12 (CYANOCOBALAMIN) 1000 MCG tablet Take 1,000 mcg by mouth daily.   Yes Historical Provider, MD   Dg Chest 2 View  Result Date: 09/11/2015 CLINICAL DATA:  Generalized weakness EXAM: CHEST  2 VIEW COMPARISON:  August 08, 2015 FINDINGS: There is no edema or consolidation. The heart size and pulmonary vascularity are normal. No adenopathy. There is atherosclerotic calcification in the aorta. The patient is status post coronary artery bypass grafting. Bones are osteoporotic. There is a total shoulder replacement on the left. There is calcification in the left carotid artery. IMPRESSION: No edema or consolidation. Areas of aortic atherosclerosis. There is also left carotid artery calcification. Bones osteoporotic. Electronically Signed   By: Bretta BangWilliam  Woodruff III M.D.   On: 09/11/2015 11:44   Ct Head Wo Contrast  Result Date: 09/11/2015 CLINICAL DATA:  Generalized weakness in not feeling well. EXAM: CT HEAD WITHOUT CONTRAST  TECHNIQUE: Contiguous axial images were obtained from the base of the skull through the vertex without intravenous contrast. COMPARISON:  CT head without contrast 08/08/2015. FINDINGS: Brain: Moderate generalized atrophy and diffuse white matter disease is again noted. No acute infarct, hemorrhage, or mass lesion is present. Vascular: Atherosclerotic calcifications are present within the cavernous internal carotid arteries and at the dural margin of the vertebral arteries bilaterally. Skull: The calvarium  is intact. Sinuses/Orbits: The visualized paranasal sinuses are clear. Bilateral lens replacements are present. The globes and orbits are otherwise intact. Other: No significant extracranial soft tissue lesions are evident. IMPRESSION: 1. No acute intracranial abnormality or significant interval change. 2. Stable moderate atrophy and white matter disease. Electronically Signed   By: Marin Robertshristopher  Mattern M.D.   On: 09/11/2015 17:01   Dg Hip Unilat W Or Wo Pelvis 2-3 Views Left  Result Date: 09/11/2015 CLINICAL DATA:  Pain with deformity EXAM: DG HIP (WITH OR WITHOUT PELVIS) 2-3V LEFT COMPARISON:  None. FINDINGS: There frontal pelvis as well as frontal and lateral left hip images were obtained. There is a comminuted intertrochanteric femur fracture on the left with varus angulation at the fracture site. There is avulsion of the lesser trochanter medially. No other fractures are evident. There is mild symmetric narrowing both hip joints. Bones are osteoporotic. No dislocation. There is extensive arterial vascular calcification at multiple sites. IMPRESSION: Comminuted intertrochanteric femur fracture on the left with varus angulation fracture slight avulsion lesser trochanter medially. Bones osteoporotic. Mild narrowing both hip joints, symmetric. No dislocation. Extensive arterial vascular calcifications/atherosclerosis. Electronically Signed   By: Bretta BangWilliam  Woodruff III M.D.   On: 09/11/2015 11:45    Positive ROS: All other systems have been reviewed and were otherwise negative with the exception of those mentioned in the HPI and as above.  Physical Exam: General: Alert, no acute distress  MUSCULOSKELETAL: Left lower extremity: Patient's skin is intact. There is no erythema or ecchymosis swelling of the left lower 70. Patient has shortening and external rotation of the left lower extremity. She has palpable pedal pulses and spontaneous flexion and extension of her toes.  Assessment: Intertrochanteric  fracture of the left hip  Plan: I have reviewed with the patient's family the details of her fracture. I recommended intramedullary fixation for this fracture. Patient is admitted to the hospitalist service for preoperative evaluation and clearance. Patient is at moderate risk for surgery based on her age and medical comorbidities.  The medical H and P states that her blood pressure and blood sugars need to be better controlled before surgery. I'm hopeful that patient can undergo surgery tomorrow. She is on Plavix at baseline but will not receive any preoperative anticoagulation tonight. I reviewed the risks and benefits of surgery with the patient's family. They understand the risks include infection, bleeding, nerve or blood vessel injury, malunion, nonunion, persistent pain, hardware failure and the need for further surgery. They understand medical risks include but are not limited to DVT and pulmonary embolism, myocardial infarction, stroke, pneumonia, respiratory failure and death. Patient's family understood these risks and agreed with the plan for surgery. I have answered all their questions. I signed the surgical site with my initials and the word yes according the hospital's correct site of surgery protocol. I will reevaluate the patient in the morning and speak with the medical team to determine whether she can proceed with surgery tomorrow.    Juanell FairlyKRASINSKI, Kaiser Belluomini, MD    09/11/2015 6:31 PM

## 2015-09-11 NOTE — ED Notes (Signed)
Pt to xray now

## 2015-09-11 NOTE — ED Provider Notes (Addendum)
[facility] Emergency Department Provider Note ____________________________________________   I have reviewed the triage vital signs and the triage nursing note.  HISTORY  Chief Complaint Weakness   Historian Level 5 caveat - patient with dementia  Patient's daughter  HPI Kelli Valenzuela is a 82 y.o. female who recentlyadmitted in the hospital for hypoglycemic episode due to change in her Lantus dosing, resulted in her being discharged to "[facility]" for higher level of care at her dementia facility. She was admitted into that facility on Sunday evening and daughter states that she was walking just fine then. Per the daughter's understanding from the facility there is no issues yesterday. Upon waking this morning, patient was found have pain at the left hip and shortened left leg.  Patient left hip is moderate. No other evidence of trauma visibly, nor complaints from the patient.   Past Medical History:  Diagnosis Date  . Coronary artery disease    s/p MI  . Dementia   . Depression   . Diabetes mellitus without complication (HCC)    secondary to gallstone pancreatitis and subsequent pancreatitc destruction  . Hyperlipidemia     Patient Active Problem List   Diagnosis Date Noted  . Hypoglycemia 09/08/2015    Past Surgical History:  Procedure Laterality Date  . EXPLORATORY LAPAROTOMY  1990   multiple  . PERCUTANEOUS CORONARY STENT INTERVENTION (PCI-S)     LAD    Prior to Admission medications   Medication Sig Start Date End Date Taking? Authorizing Provider  buPROPion (WELLBUTRIN) 100 MG tablet Take 100 mg by mouth every other day.   Yes Historical Provider, MD  carvedilol (COREG) 6.25 MG tablet Take 6.25 mg by mouth 2 (two) times daily with a meal.   Yes Historical Provider, MD  clopidogrel (PLAVIX) 75 MG tablet Take 75 mg by mouth daily.   Yes Historical Provider, MD  insulin glargine (LANTUS) 100 UNIT/ML injection Inject 0.1 mLs (10 Units total)  into the skin at bedtime. 09/09/15  Yes Sital Mody, MD  lisinopril (PRINIVIL,ZESTRIL) 2.5 MG tablet Take 2.5 mg by mouth daily.   Yes Historical Provider, MD  meloxicam (MOBIC) 7.5 MG tablet Take 7.5 mg by mouth daily.   Yes Historical Provider, MD  vitamin B-12 (CYANOCOBALAMIN) 1000 MCG tablet Take 1,000 mcg by mouth daily.   Yes Historical Provider, MD    Allergies  Allergen Reactions  . Sulfa Antibiotics Hives    Family History  Problem Relation Age of Onset  . Ovarian cancer Mother   . Esophageal varices Father     Social History Social History  Substance Use Topics  . Smoking status: Never Smoker  . Smokeless tobacco: Never Used  . Alcohol use No    Review of Systems  Constitutional: Recent issues with hypoglycemia and then hyperglycemia while changing Lantus dosing.. Eyes: Negative for visual changes. ENT: Negative for sore throat. Cardiovascular: Negative for chest pain. Respiratory: Negative for shortness of breath.  Complained of cough when I asked her. Gastrointestinal: Negative for abdominal pain, vomiting and diarrhea. Genitourinary: Negative for dysuria. Musculoskeletal: Negative for back pain. Skin: Negative for rash. Neurological: Negative for headache. 10 point Review of Systems otherwise negative ____________________________________________   PHYSICAL EXAM:  VITAL SIGNS: ED Triage Vitals [09/11/15 0933]  Enc Vitals Group     BP 131/70     Pulse Rate 86     Resp 15     Temp 97.2 F (36.2 C)     Temp Source Oral  SpO2 98 %     Weight      Height      Head Circumference      Peak Flow      Pain Score      Pain Loc      Pain Edu?      Excl. in GC?      Constitutional: Alert and Cooperative, but disoriented. Well appearing and in no distress. HEENT   Head: Normocephalic and atraumatic.      Eyes: Conjunctivae are normal. PERRL. Normal extraocular movements.      Ears:         Nose: No congestion/rhinnorhea.   Mouth/Throat:  Mucous membranes are moist.   Neck: No stridor. Cardiovascular/Chest: Normal rate, regular rhythm.  No murmurs, rubs, or gallops. Respiratory: Normal respiratory effort without tachypnea nor retractions. Breath sounds are clear and equal bilaterally. No wheezes/rales/rhonchi. Gastrointestinal: Soft. No distention, no guarding, no rebound. Nontender.    Genitourinary/rectal:Deferred Musculoskeletal: Pelvis stable to compression, but pain at the left hip with any range of motion. Left leg is shortened and slightly externally rotated. Neurologic:  Dementia, but no slurred speech or gross or focal neurologic deficits are appreciated. Skin:  Skin is warm, dry and intact. No rash noted. Psychiatric: No agitation.  ____________________________________________   EKG I, Governor Rooks, MD, the attending physician have personally viewed and interpreted all ECGs.  90 bpm. Normal sinus rhythm. Right bundle branch block. Normal axis. Nonspecific ST and T-wave. ____________________________________________  LABS (pertinent positives/negatives)  Labs Reviewed  COMPREHENSIVE METABOLIC PANEL - Abnormal; Notable for the following:       Result Value   Sodium 134 (*)    Chloride 100 (*)    Glucose, Bld 444 (*)    BUN 37 (*)    Alkaline Phosphatase 129 (*)    GFR calc non Af Amer 54 (*)    All other components within normal limits  CBC WITH DIFFERENTIAL/PLATELET - Abnormal; Notable for the following:    WBC 14.0 (*)    Hemoglobin 11.3 (*)    HCT 33.5 (*)    Neutro Abs 12.5 (*)    Lymphs Abs 0.8 (*)    All other components within normal limits  URINALYSIS COMPLETEWITH MICROSCOPIC ([facility] ONLY) - Abnormal; Notable for the following:    Color, Urine YELLOW (*)    APPearance CLEAR (*)    Glucose, UA >500 (*)    Ketones, ur 1+ (*)    Squamous Epithelial / LPF 0-5 (*)    All other components within normal limits  TROPONIN I    ____________________________________________  RADIOLOGY All Xrays  were viewed by me. Imaging interpreted by Radiologist.  Left hip x-ray:IMPRESSION: Comminuted intertrochanteric femur fracture on the left with varus angulation fracture slight avulsion lesser trochanter medially. Bones osteoporotic. Mild narrowing both hip joints, symmetric. No dislocation. Extensive arterial vascular Calcifications/atherosclerosis.  Chest x-ray two-view: IMPRESSION: No edema or consolidation. Areas of aortic atherosclerosis. There is also left carotid artery calcification. Bones osteoporotic. __________________________________________  PROCEDURES  Procedure(s) performed: None  Critical Care performed: None  ____________________________________________   ED COURSE / ASSESSMENT AND PLAN  Pertinent labs & imaging results that were available during my care of the patient were reviewed by me and considered in my medical decision making (see chart for details).   No reported known trauma, and patient has dementia and is unable to provide any history, but her hip looks to possibly clinically have fracture.  She clinical cough and chest x-ray and this  was negative. She's not hypoxic.  Her hip does show intertrochanteric fracture. I consulted orthopedic surgeon, Dr. Martha ClanKrasinski - plans for surgery tomorrow.  Hospitalist for admission.   CONSULTATIONS:   Orthopedic surgery and hospitalist.   Patient / Family / Caregiver informed of clinical course, medical decision-making process, and agree with plan.  ___________________________________________   FINAL CLINICAL IMPRESSION(S) / ED DIAGNOSES   Final diagnoses:  Closed left hip fracture, initial encounter [facility](HCC)              Note: This dictation was prepared with Dragon dictation. Any transcriptional errors that result from this process are unintentional    Governor Rooksebecca Llana Deshazo, MD 09/11/15 1506    Governor Rooksebecca Carmeline Kowal, MD 09/11/15 1534

## 2015-09-11 NOTE — ED Notes (Signed)
In and out cath completed by this RN, assisted by Bennetta LaosStephen RN. Patient tolerated well. Clear, amber urine returned without difficulty.

## 2015-09-11 NOTE — Progress Notes (Signed)
Two attempts. Unable to insert foley catheter with two different nurses.

## 2015-09-12 ENCOUNTER — Encounter
Admission: EM | Disposition: A | Discharge status: Skilled Nursing Facility | Payer: Self-pay | Source: Home / Self Care | Attending: Internal Medicine

## 2015-09-12 ENCOUNTER — Inpatient Hospital Stay: Payer: Medicare Other | Admitting: Anesthesiology

## 2015-09-12 ENCOUNTER — Encounter: Payer: Self-pay | Admitting: *Deleted

## 2015-09-12 ENCOUNTER — Inpatient Hospital Stay: Payer: Medicare Other

## 2015-09-12 HISTORY — PX: INTRAMEDULLARY (IM) NAIL INTERTROCHANTERIC: SHX5875

## 2015-09-12 LAB — GLUCOSE, CAPILLARY
GLUCOSE-CAPILLARY: 319 mg/dL — AB (ref 65–99)
GLUCOSE-CAPILLARY: 227 mg/dL — AB (ref 65–99)
GLUCOSE-CAPILLARY: 173 mg/dL — AB (ref 65–99)
GLUCOSE-CAPILLARY: 420 mg/dL — AB (ref 65–99)
Glucose-Capillary: 301 mg/dL — ABNORMAL HIGH (ref 65–99)
Glucose-Capillary: 381 mg/dL — ABNORMAL HIGH (ref 65–99)

## 2015-09-12 LAB — CBC
HEMATOCRIT: 28.7 % — AB (ref 35.0–47.0)
HEMOGLOBIN: 9.7 g/dL — AB (ref 12.0–16.0)
MCH: 29.5 pg (ref 26.0–34.0)
MCHC: 33.9 g/dL (ref 32.0–36.0)
MCV: 87.1 fL (ref 80.0–100.0)
PLATELETS: 223 10*3/uL (ref 150–440)
RBC: 3.3 MIL/uL — AB (ref 3.80–5.20)
RDW: 13.9 % (ref 11.5–14.5)
WBC: 10.5 10*3/uL (ref 3.6–11.0)

## 2015-09-12 LAB — BASIC METABOLIC PANEL
ANION GAP: 7 (ref 5–15)
BUN: 47 mg/dL — ABNORMAL HIGH (ref 6–20)
CHLORIDE: 103 mmol/L (ref 101–111)
CO2: 25 mmol/L (ref 22–32)
Calcium: 8.6 mg/dL — ABNORMAL LOW (ref 8.9–10.3)
Creatinine, Ser: 1.24 mg/dL — ABNORMAL HIGH (ref 0.44–1.00)
GFR calc non Af Amer: 38 mL/min — ABNORMAL LOW (ref >60)
GFR, EST AFRICAN AMERICAN: 44 mL/min — AB (ref >60)
Glucose, Bld: 270 mg/dL — ABNORMAL HIGH (ref 65–99)
POTASSIUM: 4.1 mmol/L (ref 3.5–5.1)
SODIUM: 135 mmol/L (ref 135–145)

## 2015-09-12 LAB — HEMOGLOBIN A1C: HEMOGLOBIN A1C: 7.5 % — AB (ref 4.0–6.0)

## 2015-09-12 SURGERY — FIXATION, FRACTURE, INTERTROCHANTERIC, WITH INTRAMEDULLARY ROD
Anesthesia: Choice | Laterality: Left

## 2015-09-12 MED ORDER — ENOXAPARIN SODIUM 30 MG/0.3ML ~~LOC~~ SOLN
30.0000 mg | SUBCUTANEOUS | Status: DC
  Administered 2015-09-12: 30 mg via SUBCUTANEOUS
  Filled 2015-09-12: qty 0.3

## 2015-09-12 MED ORDER — INSULIN GLARGINE 100 UNIT/ML ~~LOC~~ SOLN
12.0000 [IU] | Freq: Every day | SUBCUTANEOUS | Status: DC
  Filled 2015-09-12: qty 0.12

## 2015-09-12 MED ORDER — SENNA 8.6 MG PO TABS
1.0000 | ORAL_TABLET | Freq: Two times a day (BID) | ORAL | Status: DC
  Administered 2015-09-12 – 2015-09-14 (×5): 8.6 mg via ORAL
  Filled 2015-09-12 (×5): qty 1

## 2015-09-12 MED ORDER — FERROUS SULFATE 325 (65 FE) MG PO TABS
325.0000 mg | ORAL_TABLET | Freq: Three times a day (TID) | ORAL | Status: DC
  Administered 2015-09-12 – 2015-09-14 (×6): 325 mg via ORAL
  Filled 2015-09-12 (×6): qty 1

## 2015-09-12 MED ORDER — DOCUSATE SODIUM 100 MG PO CAPS
100.0000 mg | ORAL_CAPSULE | Freq: Two times a day (BID) | ORAL | Status: DC
  Administered 2015-09-12 – 2015-09-14 (×4): 100 mg via ORAL
  Filled 2015-09-12 (×4): qty 1

## 2015-09-12 MED ORDER — POLYETHYLENE GLYCOL 3350 17 G PO PACK: 17.0000 g | PACK | Freq: Every day | ORAL | Status: DC | PRN

## 2015-09-12 MED ORDER — ONDANSETRON HCL 4 MG/2ML IJ SOLN: 4.0000 mg | Freq: Once | INTRAMUSCULAR | Status: DC | PRN

## 2015-09-12 MED ORDER — PROPOFOL 10 MG/ML IV BOLUS
INTRAVENOUS | Status: DC | PRN
  Administered 2015-09-12: 120 mg via INTRAVENOUS

## 2015-09-12 MED ORDER — CLOPIDOGREL BISULFATE 75 MG PO TABS
75.0000 mg | ORAL_TABLET | Freq: Every day | ORAL | Status: DC
  Administered 2015-09-12 – 2015-09-13 (×2): 75 mg via ORAL
  Filled 2015-09-12 (×2): qty 1

## 2015-09-12 MED ORDER — MAGNESIUM CITRATE PO SOLN
1.0000 | Freq: Once | ORAL | Status: DC | PRN
  Filled 2015-09-12: qty 296

## 2015-09-12 MED ORDER — SODIUM CHLORIDE 0.9 % IV SOLN
75.0000 mL/h | INTRAVENOUS | Status: DC
  Administered 2015-09-12 – 2015-09-13 (×3): 75 mL/h via INTRAVENOUS

## 2015-09-12 MED ORDER — INSULIN GLARGINE 100 UNIT/ML ~~LOC~~ SOLN
15.0000 [IU] | Freq: Every day | SUBCUTANEOUS | Status: DC
  Administered 2015-09-12: 15 [IU] via SUBCUTANEOUS
  Filled 2015-09-12 (×2): qty 0.15

## 2015-09-12 MED ORDER — ASPIRIN EC 325 MG PO TBEC
325.0000 mg | DELAYED_RELEASE_TABLET | Freq: Every day | ORAL | Status: DC
  Administered 2015-09-13 – 2015-09-14 (×2): 325 mg via ORAL
  Filled 2015-09-12 (×2): qty 1

## 2015-09-12 MED ORDER — ONDANSETRON HCL 4 MG/2ML IJ SOLN
INTRAMUSCULAR | Status: DC | PRN
  Administered 2015-09-12: 4 mg via INTRAVENOUS

## 2015-09-12 MED ORDER — PHENOL 1.4 % MT LIQD
1.0000 | OROMUCOSAL | Status: DC | PRN
  Filled 2015-09-12: qty 177

## 2015-09-12 MED ORDER — ACETAMINOPHEN 325 MG PO TABS
650.0000 mg | ORAL_TABLET | Freq: Four times a day (QID) | ORAL | Status: DC | PRN
Start: 2015-09-12 — End: 2015-09-14
  Administered 2015-09-12 – 2015-09-14 (×2): 650 mg via ORAL
  Filled 2015-09-12 (×2): qty 2

## 2015-09-12 MED ORDER — SODIUM CHLORIDE 0.9 % IR SOLN
Status: DC | PRN
  Administered 2015-09-12: 500 mL

## 2015-09-12 MED ORDER — ALUM & MAG HYDROXIDE-SIMETH 200-200-20 MG/5ML PO SUSP: 30.0000 mL | ORAL | Status: DC | PRN

## 2015-09-12 MED ORDER — FENTANYL CITRATE (PF) 100 MCG/2ML IJ SOLN: 25.0000 ug | INTRAMUSCULAR | Status: DC | PRN

## 2015-09-12 MED ORDER — FENTANYL CITRATE (PF) 100 MCG/2ML IJ SOLN
INTRAMUSCULAR | Status: AC
  Administered 2015-09-12: 25 ug via INTRAVENOUS
  Filled 2015-09-12: qty 2

## 2015-09-12 MED ORDER — OXYCODONE HCL 5 MG PO TABS
5.0000 mg | ORAL_TABLET | ORAL | Status: DC | PRN
  Administered 2015-09-12 – 2015-09-13 (×5): 5 mg via ORAL
  Filled 2015-09-12 (×5): qty 1

## 2015-09-12 MED ORDER — CEFAZOLIN SODIUM-DEXTROSE 2-4 GM/100ML-% IV SOLN
INTRAVENOUS | Status: AC
  Filled 2015-09-12: qty 100

## 2015-09-12 MED ORDER — INSULIN ASPART 100 UNIT/ML ~~LOC~~ SOLN
0.0000 [IU] | Freq: Three times a day (TID) | SUBCUTANEOUS | Status: DC
  Administered 2015-09-13 (×2): 5 [IU] via SUBCUTANEOUS
  Filled 2015-09-12: qty 3
  Filled 2015-09-12: qty 5

## 2015-09-12 MED ORDER — MENTHOL 3 MG MT LOZG
1.0000 | LOZENGE | OROMUCOSAL | Status: DC | PRN
  Filled 2015-09-12: qty 9

## 2015-09-12 MED ORDER — NEOMYCIN-POLYMYXIN B GU 40-200000 IR SOLN
Status: AC
  Filled 2015-09-12: qty 2

## 2015-09-12 MED ORDER — LIDOCAINE HCL (CARDIAC) 20 MG/ML IV SOLN
INTRAVENOUS | Status: DC | PRN
  Administered 2015-09-12: 40 mg via INTRAVENOUS
  Administered 2015-09-12: 60 mg via INTRAVENOUS

## 2015-09-12 MED ORDER — ACETAMINOPHEN 650 MG RE SUPP: 650.0000 mg | Freq: Four times a day (QID) | RECTAL | Status: DC | PRN

## 2015-09-12 MED ORDER — FENTANYL CITRATE (PF) 100 MCG/2ML IJ SOLN
INTRAMUSCULAR | Status: DC | PRN
Start: 2015-09-12 — End: 2015-09-12
  Administered 2015-09-12 (×4): 25 ug via INTRAVENOUS

## 2015-09-12 MED ORDER — ONDANSETRON HCL 4 MG/2ML IJ SOLN: 4.0000 mg | Freq: Four times a day (QID) | INTRAMUSCULAR | Status: DC | PRN

## 2015-09-12 MED ORDER — CEFAZOLIN SODIUM-DEXTROSE 2-4 GM/100ML-% IV SOLN
2.0000 g | Freq: Four times a day (QID) | INTRAVENOUS | Status: AC
  Administered 2015-09-12 (×2): 2 g via INTRAVENOUS
  Filled 2015-09-12 (×2): qty 100

## 2015-09-12 MED ORDER — PHENYLEPHRINE HCL 10 MG/ML IJ SOLN
INTRAMUSCULAR | Status: DC | PRN
  Administered 2015-09-12 (×3): 150 ug via INTRAVENOUS
  Administered 2015-09-12: 100 ug via INTRAVENOUS
  Administered 2015-09-12: 50 ug via INTRAVENOUS
  Administered 2015-09-12 (×2): 100 ug via INTRAVENOUS

## 2015-09-12 MED ORDER — HALOPERIDOL LACTATE 5 MG/ML IJ SOLN: 2.0000 mg | Freq: Once | INTRAMUSCULAR | Status: DC | PRN

## 2015-09-12 MED ORDER — MORPHINE SULFATE (PF) 2 MG/ML IV SOLN
2.0000 mg | INTRAVENOUS | Status: DC | PRN
  Administered 2015-09-12: 2 mg via INTRAVENOUS
  Filled 2015-09-12: qty 1

## 2015-09-12 MED ORDER — BISACODYL 10 MG RE SUPP
10.0000 mg | Freq: Every day | RECTAL | Status: DC | PRN
  Administered 2015-09-14: 10 mg via RECTAL
  Filled 2015-09-12: qty 1

## 2015-09-12 MED ORDER — ONDANSETRON HCL 4 MG PO TABS: 4.0000 mg | ORAL_TABLET | Freq: Four times a day (QID) | ORAL | Status: DC | PRN

## 2015-09-12 MED ORDER — SODIUM CHLORIDE 0.9 % IV SOLN
INTRAVENOUS | Status: DC | PRN
  Administered 2015-09-12: 50 ug/min via INTRAVENOUS

## 2015-09-12 MED ORDER — INSULIN ASPART 100 UNIT/ML ~~LOC~~ SOLN
0.0000 [IU] | Freq: Every day | SUBCUTANEOUS | Status: DC
  Administered 2015-09-12: 5 [IU] via SUBCUTANEOUS
  Filled 2015-09-12: qty 5

## 2015-09-12 MED ORDER — FENTANYL CITRATE (PF) 100 MCG/2ML IJ SOLN
25.0000 ug | INTRAMUSCULAR | Status: DC | PRN
  Administered 2015-09-12: 25 ug via INTRAVENOUS

## 2015-09-12 SURGICAL SUPPLY — 38 items
BIT DRILL 4.3MMS DISTAL GRDTED (BIT) ×1 IMPLANT
BNDG COHESIVE 6X5 TAN STRL LF (GAUZE/BANDAGES/DRESSINGS) ×6 IMPLANT
CANISTER SUCT 1200ML W/VALVE (MISCELLANEOUS) ×3 IMPLANT
CORTICAL BONE SCR 5.0MM X 48MM (Screw) ×3 IMPLANT
DRAPE SHEET LG 3/4 BI-LAMINATE (DRAPES) ×6 IMPLANT
DRAPE SURG 17X11 SM STRL (DRAPES) ×6 IMPLANT
DRAPE U-SHAPE 47X51 STRL (DRAPES) ×3 IMPLANT
DRILL 4.3MMS DISTAL GRADUATED (BIT) ×3
DRSG OPSITE POSTOP 3X4 (GAUZE/BANDAGES/DRESSINGS) ×3 IMPLANT
DRSG OPSITE POSTOP 4X14 (GAUZE/BANDAGES/DRESSINGS) IMPLANT
DURAPREP 26ML APPLICATOR (WOUND CARE) ×6 IMPLANT
ELECT REM PT RETURN 9FT ADLT (ELECTROSURGICAL) ×3
ELECTRODE REM PT RTRN 9FT ADLT (ELECTROSURGICAL) ×1 IMPLANT
GLOVE BIOGEL PI IND STRL 9 (GLOVE) ×1 IMPLANT
GLOVE BIOGEL PI INDICATOR 9 (GLOVE) ×2
GLOVE SURG 9.0 ORTHO LTXF (GLOVE) ×6 IMPLANT
GOWN STRL REUS TWL 2XL XL LVL4 (GOWN DISPOSABLE) ×3 IMPLANT
GOWN STRL REUS W/ TWL LRG LVL3 (GOWN DISPOSABLE) ×1 IMPLANT
GOWN STRL REUS W/TWL LRG LVL3 (GOWN DISPOSABLE) ×2
GUIDEPIN VERSANAIL DSP 3.2X444 ×3 IMPLANT
GUIDEWIRE BALL NOSE 100CM (WIRE) ×3 IMPLANT
HEMOVAC 400CC 10FR (MISCELLANEOUS) ×3 IMPLANT
KIT RM TURNOVER CYSTO AR (KITS) ×3 IMPLANT
MAT BLUE FLOOR 46X72 FLO (MISCELLANEOUS) ×3 IMPLANT
NAIL HIP FRA AFFIX 130X9X380 L (Nail) ×3 IMPLANT
NS IRRIG 1000ML POUR BTL (IV SOLUTION) ×3 IMPLANT
PACK HIP COMPR (MISCELLANEOUS) ×3 IMPLANT
SCREW BONE CORTICAL 5.0X44 (Screw) ×3 IMPLANT
SCREW CORTICL BON 5.0MM X 48MM (Screw) ×1 IMPLANT
SCREW LAG 10.5MMX105MM HFN (Screw) ×3 IMPLANT
STAPLER SKIN PROX 35W (STAPLE) ×3 IMPLANT
SUCTION FRAZIER HANDLE 10FR (MISCELLANEOUS) ×2
SUCTION TUBE FRAZIER 10FR DISP (MISCELLANEOUS) ×1 IMPLANT
SUT VIC AB 0 CT1 36 (SUTURE) ×6 IMPLANT
SUT VIC AB 2-0 CT1 27 (SUTURE) ×2
SUT VIC AB 2-0 CT1 TAPERPNT 27 (SUTURE) ×1 IMPLANT
SUT VICRYL 0 AB UR-6 (SUTURE) ×3 IMPLANT
SYR 30ML LL (SYRINGE) ×3 IMPLANT

## 2015-09-12 NOTE — Progress Notes (Signed)
Anticoagulation monitoring(Lovenox):  82 yo  Female  ordered Lovenox 40 mg Q24h  Filed Weights   09/12/15 1025  Weight: 134 lb (60.8 kg)   BMI    Lab Results  Component Value Date   CREATININE 1.24 (H) 09/12/2015   CREATININE 1.02 (H) 09/11/2015   CREATININE 0.92 09/11/2015   Estimated Creatinine Clearance: 27.6 mL/min (by C-G formula based on SCr of 1.24 mg/dL). Hemoglobin & Hematocrit     Component Value Date/Time   HGB 9.7 (L) 09/12/2015 0636   HCT 28.7 (L) 09/12/2015 0636     Per Protocol for Patient with estCrcl < 30 ml/min and BMI < 40, will transition to Lovenox 30 mg Q24h.

## 2015-09-12 NOTE — Addendum Note (Signed)
Addendum  created 09/12/15 1351 by [name], MD   Order sets accessed

## 2015-09-12 NOTE — Progress Notes (Signed)
Dr. Lillia MountainHucklemeyer notified about pts. fs being 420. Give 5 units of regular and give lantus.

## 2015-09-12 NOTE — Transfer of Care (Signed)
Immediate Anesthesia Transfer of Care Note  Patient: Kelli Valenzuela  Procedure(s) Performed: Procedure(s): INTRAMEDULLARY (IM) NAIL INTERTROCHANTRIC (Left)  Patient Location: PACU  Anesthesia Type:General  Level of Consciousness: awake  Airway & Oxygen Therapy: Patient connected to face mask oxygen  Post-op Assessment: Post -op Vital signs reviewed and stable  Post vital signs: stable  Last Vitals:  Vitals:   09/12/15 1025 09/12/15 1251  BP: 121/61 (!) 143/73  Pulse: 89 83  Resp: 16 18  Temp: 36.2 C 36.9 C    Last Pain:  Vitals:   09/12/15 1251  TempSrc: Temporal  PainSc:          Complications: No apparent anesthesia complications

## 2015-09-12 NOTE — [place] FL2 (Signed)
[place] MEDICAID FL2 LEVEL OF CARE SCREENING TOOL     IDENTIFICATION  Patient Name: Kelli Valenzuela Birthdate: 10/11/1925 Sex: female Admission Date (Current Location): 09/11/2015  [place]   Facility and Address:  [facility], [address], [place], [place] [address]      Provider Number: [id number]  Attending Physician Name and Address:  Enedina FinnerSona Patel, MD  Relative Name and Phone Number:       Current Level of Care: Hospital Recommended Level of Care: Skilled Nursing Facility Prior Approval Number:    Date Approved/Denied:   PASRR Number:  [id number])  Discharge Plan: SNF    Current Diagnoses: Patient Active Problem List   Diagnosis Date Noted  . Hip fracture (HCC) 09/11/2015  . Hypoglycemia 09/08/2015    Orientation RESPIRATION BLADDER Height & Weight     Self  Normal Incontinent Weight:   Height:     BEHAVIORAL SYMPTOMS/MOOD NEUROLOGICAL BOWEL NUTRITION STATUS   (none )  (none ) Incontinent Diet (NPO for surgery )  AMBULATORY STATUS COMMUNICATION OF NEEDS Skin   Extensive Assist Verbally Surgical wounds                       Personal Care Assistance Level of Assistance  Bathing, Feeding, Dressing Bathing Assistance: Limited assistance Feeding assistance: Independent Dressing Assistance: Limited assistance     Functional Limitations Info  Sight, Hearing, Speech Sight Info: Adequate Hearing Info: Adequate Speech Info: Adequate    SPECIAL CARE FACTORS FREQUENCY  PT (By licensed PT), OT (By licensed OT)     PT Frequency:  (5) OT Frequency:  (5)            Contractures      Additional Factors Info  Code Status, Allergies, Insulin Sliding Scale Code Status Info:  (DNR ) Allergies Info:  (Sulfa Antibiotics )   Insulin Sliding Scale Info:  (NovoLog Insulin Injections 3 times per day. )       Current Medications (09/12/2015):  This is the current hospital active medication  list Current Facility-Administered Medications  Medication Dose Route Frequency Provider Last Rate Last Dose  . 0.9 %  sodium chloride infusion   Intravenous Continuous Enid Baasadhika Kalisetti, MD 60 mL/hr at 09/11/15 2250    . acetaminophen (TYLENOL) tablet 650 mg  650 mg Oral Q6H PRN Enid Baasadhika Kalisetti, MD       Or  . acetaminophen (TYLENOL) suppository 650 mg  650 mg Rectal Q6H PRN Enid Baasadhika Kalisetti, MD      . buPROPion [facility](WELLBUTRIN) tablet 100 mg  100 mg Oral QODAY Enid Baasadhika Kalisetti, MD      . carvedilol (COREG) tablet 6.25 mg  6.25 mg Oral BID WC Enid Baasadhika Kalisetti, MD   6.25 mg at 09/12/15 0836  . ceFAZolin (ANCEF) IVPB 2g/100 mL premix  2 g Intravenous 30 min Pre-Op Juanell FairlyKevin Krasinski, MD      . insulin aspart (novoLOG) injection 0-5 Units  0-5 Units Subcutaneous QHS Enid Baasadhika Kalisetti, MD   4 Units at 09/11/15 2238  . insulin aspart (novoLOG) injection 0-9 Units  0-9 Units Subcutaneous TID WC Enid Baasadhika Kalisetti, MD   7 Units at 09/12/15 0836  . insulin glargine (LANTUS) injection 10 Units  10 Units Subcutaneous QHS Enid Baasadhika Kalisetti, MD   10 Units at 09/11/15 2238  . meloxicam (MOBIC) tablet 7.5 mg  7.5 mg Oral Daily Enid Baasadhika Kalisetti, MD      . ondansetron [facility](ZOFRAN) tablet 4 mg  4 mg Oral  Q6H PRN Enid Baasadhika Kalisetti, MD       Or  . ondansetron (ZOFRAN) injection 4 mg  4 mg Intravenous Q6H PRN Enid Baasadhika Kalisetti, MD      . traMADol Janean Sark(ULTRAM) tablet 50 mg  50 mg Oral Q6H PRN Enid Baasadhika Kalisetti, MD      . vitamin B-12 (CYANOCOBALAMIN) tablet 1,000 mcg  1,000 mcg Oral Daily Enid Baasadhika Kalisetti, MD         Discharge Medications: Please see discharge summary for a list of discharge medications.  Relevant Imaging Results:  Relevant Lab Results:   Additional Information  (SSN: [id number])  Mintie Witherington, Darleen CrockerBailey M, LCSW

## 2015-09-12 NOTE — Anesthesia Procedure Notes (Signed)
Procedure Name: LMA Insertion Date/Time: 09/12/2015 11:06 AM Performed by: Irving BurtonBACHICH, Korver Graybeal Pre-anesthesia Checklist: Patient identified, Emergency Drugs available, Suction available and Patient being monitored Patient Re-evaluated:Patient Re-evaluated prior to inductionOxygen Delivery Method: Circle system utilized Preoxygenation: Pre-oxygenation with 100% oxygen Intubation Type: IV induction Ventilation: Mask ventilation without difficulty LMA: LMA inserted LMA Size: 3.5 Number of attempts: 1 Placement Confirmation: positive ETCO2 and breath sounds checked- equal and bilateral Tube secured with: Tape Dental Injury: Teeth and Oropharynx as per pre-operative assessment

## 2015-09-12 NOTE — Progress Notes (Addendum)
Inpatient Diabetes Program Recommendations  AACE/ADA: New Consensus Statement on Inpatient Glycemic Control (2015)  Target Ranges:  Prepandial:   less than 140 mg/dL      Peak postprandial:   less than 180 mg/dL (1-2 hours)      Critically ill patients:  140 - 180 mg/dL   Results for Kelli Valenzuela, Kelli Valenzuela (MRN [id number]) as of 09/12/2015 09:38  Ref. Range 09/11/2015 15:10 09/11/2015 21:44 09/11/2015 21:45 09/12/2015 08:09  Glucose-Capillary Latest Ref Range: 65 - 99 mg/dL 409384 (H) 811316 (H) 914312 (H) 319 (H)   Review of Glycemic Control  Diabetes history: DM2 Outpatient Diabetes medications: Lantus 10 units QHS Current orders for Inpatient glycemic control: Lantus 10 units QHS, Novolog 0-9 units TID with meals, Novolog 0-5 units QHS  Inpatient Diabetes Program Recommendations: Insulin - Basal: Please consider increasing Lantus to 12 units QHS. Insulin - Meal Coverage: Once diet is ordered and if patient eats at least 50% of meals and post prandial glucose is consistently elevated, please consider ordering Novolog 3 units TID with meals for meal coverage.  Thanks, Orlando PennerMarie Deauna Yaw, RN, MSN, CDE Diabetes Coordinator Inpatient Diabetes Program [phone] (Team Pager from 8am to 5pm) [phone] ([facility] office) [phone] [facility] office) [phone] [facility] office)

## 2015-09-12 NOTE — Clinical Social Work Note (Signed)
Clinical Social Work Assessment  Patient Details  Name: Kelli Valenzuela MRN: [id number] Date of Birth: 10/04/1925  Date of referral:  09/12/15               Reason for consult:  Facility Placement, Other (Comment Required) ([facility] )                Permission sought to share information with:  Oceanographeracility Contact Representative Permission granted to share information::  Yes, Verbal Permission Granted  Name::      Skilled Nursing Facility   Agency::   [place]   Relationship::     Contact Information:     Housing/Transportation Living arrangements for the past 2 months:  [facility] Source of Information:  Adult Children, Power of Attorney Patient Interpreter Needed:  None Criminal Activity/Legal Involvement Pertinent to Current Situation/Hospitalization:  No - Comment as needed Significant Relationships:  Adult Children Lives with:  Facility Resident Do you feel safe going back to the place where you live?    Need for family participation in patient care:  Yes (Comment)  Care giving concerns:  [facility].   Social Worker assessment / plan:  Visual merchandiserClinical Social Worker (CSW) reviewed chart and noted that patient is [facility] and is going to surgery today for a hip fracture. CSW attempted to meet with patient however she was off the floor for surgery. CSW contacted patient's daughter/ HPOA Kelli Valenzuela [phone]) [phone] to complete assessment. Per daughter [facility] and walks with a walker, feeds and dresses herself at baseline. CSW explained that due to the nature of her injury patient will likely need to go to a higher level of care SNF for short term rehab. CSW explained that patient's Medicare will pay for days 1-20 at 100% and days 21-100 will cover at 80%. CSW explained that patient will require [facility] 3 [facility] in  order for Medicare to cover SNF. Patient was admitted to inpatient on 09/11/15. Daughter is agreeable to SN[place] search [place] and does not have a preference. SNF list was left in patient's room. Daughter asked if [facility]. CSW explained that it will depend on patient's progress with rehab.   FL2 complete and faxed out. CSW will continue to follow and assist as needed.    Employment status:  Disabled (Comment on whether or not currently receiving Disability), Retired Health and safety inspectornsurance information:  Medicare PT Recommendations:  Not assessed at this time Information / Referral to community resources:  Skilled Nursing Facility  Patient/Family's Response to care:  Patient's daughter Kelli Valenzuela is agreeable to SNF search.   Patient/Family's Understanding of and Emotional Response to Diagnosis, Current Treatment, and Prognosis:  Patient's daughter was pleasant and thanked CSW for calling.   Emotional Assessment Appearance:  Appears stated age Attitude/Demeanor/Rapport:  Unable to Assess Affect (typically observed):  Unable to Assess Orientation:  Oriented to Self, Fluctuating Orientation (Suspected and/or reported Sundowners) Alcohol / Substance use:  Not Applicable Psych involvement (Current and /or in the community):  No (Comment)  Discharge Needs  Concerns to be addressed:  Discharge Planning Concerns Readmission within the last 30 days:  Yes Current discharge risk:  Cognitively Impaired, Dependent with Mobility, Chronically ill Barriers to Discharge:  Continued Medical Work up   [employer], Darleen CrockerBailey M, LCSW  09/12/2015, 11:03 AM

## 2015-09-12 NOTE — Op Note (Signed)
DATE OF SURGERY:  09/12/2015  TIME: 12:59 PM  PATIENT NAME:  Kelli Valenzuela  AGE: 82 y.o.  PRE-OPERATIVE DIAGNOSIS:  left hip fracture  POST-OPERATIVE DIAGNOSIS:  SAME  PROCEDURE:  INTRAMEDULLARY (IM) NAIL INTERTROCHANTRIC, LEFT HIP  SURGEON:  Juanell FairlyKRASINSKI, Kyrianna Barletta  OPERATIVE IMPLANTS: Biomet Affixus nail 9 x 380mm, 105 mm lag screw with 44 and 48 mm distal interlocking screws  PREOPERATIVE INDICATIONS:  Kelli Valenzuela is a 82 y.o. year old who fell and suffered a left intertrochanteric hip fracture. She was brought into the ER and then admitted and medically cleared for surgical intervention.    The risks, benefits and alternatives were discussed with the patient and their family.  The risks include but are not limited to infection, bleeding, nerve or blood vessel injury, malunion, nonunion, hardware prominence, hardware failure, change in leg lengths or lower extremity rotation need for further surgery including hardware removal with conversion to a total hip arthroplasty. Medical risks include but are not limited to DVT and pulmonary embolism, myocardial infarction, stroke, pneumonia, respiratory failure and death. The patient and their family understood these risks and wished to proceed with surgery.  OPERATIVE PROCEDURE:  The patient was brought to the operating room and placed in the supine position on the fracture table. general anesthesia was administered due to the patient being on Plavix prior to admission.  A closed reduction was performed under C-arm guidance.  The fracture reduction was confirmed on both AP and lateral views. A time out was performed to verify the patient's name, date of birth, medical record number, correct site of surgery correct procedure to be performed. The timeout was also used to verify the patient received antibiotics and all appropriate instruments, implants and radiographic studies were available in the room. Once all in attendance were in agreement, the case  began. The patient was prepped and draped in a sterile fashion. She received preoperative antibiotics of Kefzol 2 grams IV.  An incision was made proximal to the greater trochanter in line with the femur. A guidewire was placed over the tip of the greater trochanter and advanced into the proximal femur to the level of the lesser trochanter.  Confirmation of the drill pin position was made on AP and lateral C-arm images.  The threaded guidepin was then overdrilled with the proximal femoral drill.  The nail was then inserted into the proximal femur, across the fracture site and into the femoral shaft. Its position was confirmed on AP and lateral C-arm images.   Once the nail was completely seated, the drill guide for the lag screw was placed through the guide arm for the Affixus nail. A guidepin was then placed through this drill guide and advanced through the lateral cortex of the femur, across the fracture site and into the femoral head achieving a tip apex distance of less than 25 mm. The length of the drill pin was measured, and then the drill for the lag screw was advanced through the lateral cortex, across the fracture site and up into the femoral head to the depth of the lag screw..  The lag screw was then advanced by hand into position across the fracture site into the femoral head. Its final position was confirmed on AP and lateral C-arm images. Compression was applied as traction was carefully released. The set screw in the top of the intramedullary rod was tightened by hand using a screwdriver. It was backed off a quarter turn to allow for compression at the fracture site.  The  insertion handle for the Affixus nail was then removed proximally. The attention was then turned to the distal interlocking screws.  2 distal interlocking screws were placed. A perfect circle technique was used. 2 stab incisions were made over the distal interlocking holes. A freehand technique was used to drill each hole. The  depth gauge was used to measure the width of the femur at each distal interlocking hole. 2 distal interlocking screws were then advanced into position by hand. Final AP and lateral images of the intramedullary construct were taken including proximally and distally.  The wounds were irrigated copiously and closed with 0 Vicryl for closure of the deep fascia and 2-0 Vicryl for subcutaneous closure. The skin was approximated with staples. A dry sterile dressing was applied. I was scrubbed and present the entire case and all sharp and instrument counts were correct at the conclusion of the case. Patient was transferred to hospital bed and brought to PACU in stable condition. I spoke with the patient's daughter in the postop consultation room to let her know the case had gone without complication and the patient was stable in the recovery room.    Kathreen DevoidKevin L Capucine Tryon, MD

## 2015-09-12 NOTE — Progress Notes (Signed)
[facility]   PATIENT NAME: Kelli Valenzuela    MR#:  [id number]  DATE OF BIRTH:  1925-03-04  SUBJECTIVE:  Came in after pt was found to have AMS with sugars in the 400 Per daughter very brittle diabetes for 25 years Incidental finding of left hip fracture. No fall documented at the facility  REVIEW OF SYSTEMS:   Review of Systems  Unable to perform ROS: Dementia   Tolerating Diet:yes Tolerating PT: pending  DRUG ALLERGIES:   Allergies  Allergen Reactions  . Sulfa Antibiotics Hives    VITALS:  Blood pressure (!) 117/58, pulse (!) 101, temperature 98.4 F (36.9 C), temperature source Oral, resp. rate 16, height 5\' 4"  (1.626 m), weight 134 lb (60.8 kg), SpO2 94 %.  PHYSICAL EXAMINATION:   Physical Exam  GENERAL:  82 y.o.-year-old patient lying in the bed with no acute distress.  EYES: Pupils equal, round, reactive to light and accommodation. No scleral icterus. Extraocular muscles intact.  HEENT: Head atraumatic, normocephalic. Oropharynx and nasopharynx clear.  NECK:  Supple, no jugular venous distention. No thyroid enlargement, no tenderness.  LUNGS: Normal breath sounds bilaterally, no wheezing, rales, rhonchi. No use of accessory muscles of respiration.  CARDIOVASCULAR: S1, S2 normal. No murmurs, rubs, or gallops.  ABDOMEN: Soft, nontender, nondistended. Bowel sounds present. No organomegaly or mass.  EXTREMITIES: No cyanosis, clubbing or edema b/l.    NEUROLOGIC: grossly non focal, chronic dementia PSYCHIATRIC:  patient is alert and oriented x 3.  SKIN: No obvious rash, lesion, or ulcer.   LABORATORY PANEL:  CBC  Recent Labs Lab 09/12/15 0636  WBC 10.5  HGB 9.7*  HCT 28.7*  PLT 223    Chemistries   Recent Labs Lab 09/11/15 2227 09/12/15 0636  NA 135 135  K 4.2 4.1  CL 101 103  CO2 21* 25  GLUCOSE 316* 270*  BUN 42* 47*  CREATININE 1.02* 1.24*  CALCIUM 9.0 8.6*  AST 15  --   ALT 12*  --   ALKPHOS  110  --   BILITOT 0.8  --    Cardiac Enzymes  Recent Labs Lab 09/11/15 1007  TROPONINI <0.03   RADIOLOGY:  Dg Chest 2 View  Result Date: 09/11/2015 CLINICAL DATA:  Generalized weakness EXAM: CHEST  2 VIEW COMPARISON:  August 08, 2015 FINDINGS: There is no edema or consolidation. The heart size and pulmonary vascularity are normal. No adenopathy. There is atherosclerotic calcification in the aorta. The patient is status post coronary artery bypass grafting. Bones are osteoporotic. There is a total shoulder replacement on the left. There is calcification in the left carotid artery. IMPRESSION: No edema or consolidation. Areas of aortic atherosclerosis. There is also left carotid artery calcification. Bones osteoporotic. Electronically Signed   By: Bretta Bang III M.D.   On: 09/11/2015 11:44   Ct Head Wo Contrast  Result Date: 09/11/2015 CLINICAL DATA:  Generalized weakness in not feeling well. EXAM: CT HEAD WITHOUT CONTRAST TECHNIQUE: Contiguous axial images were obtained from the base of the skull through the vertex without intravenous contrast. COMPARISON:  CT head without contrast 08/08/2015. FINDINGS: Brain: Moderate generalized atrophy and diffuse white matter disease is again noted. No acute infarct, hemorrhage, or mass lesion is present. Vascular: Atherosclerotic calcifications are present within the cavernous internal carotid arteries and at the dural margin of the vertebral arteries bilaterally. Skull: The calvarium is intact. Sinuses/Orbits: The visualized paranasal sinuses are clear. Bilateral lens replacements are present. The globes and  orbits are otherwise intact. Other: No significant extracranial soft tissue lesions are evident. IMPRESSION: 1. No acute intracranial abnormality or significant interval change. 2. Stable moderate atrophy and white matter disease. Electronically Signed   By: Marin Robertshristopher  Mattern M.D.   On: 09/11/2015 17:01   Dg Pelvis Portable  Result Date:  09/12/2015 CLINICAL DATA:  Postop IM nail EXAM: PORTABLE PELVIS 1-2 VIEWS COMPARISON:  09/11/2015 FINDINGS: Interval placement of intra medullary nail and dynamic hip screw across the left femoral intertrochanteric fracture. Lesser trochanter remains displaced. Otherwise near anatomic alignment. No subluxation or dislocation. No visible hardware complicating feature. IMPRESSION: Internal fixation across the left intertrochanteric fracture. No complicating features visualized. Electronically Signed   By: Charlett NoseKevin  Dover M.D.   On: 09/12/2015 14:23   Dg C-arm 1-60 Min  Result Date: 09/12/2015 CLINICAL DATA:  Operative imaging during left proximal femur intertrochanteric fracture ORIF. EXAM: DG C-ARM 61-120 MIN; LEFT FEMUR 2 VIEWS COMPARISON:  None. FINDINGS: Images show placement along its measure rod to the left femur supporting a compression screw. The screw has reduces the major fracture components into near anatomic alignment. There is no new fracture or evidence of an operative complication. IMPRESSION: Well aligned major fracture components following left intertrochanteric fracture ORIF. Electronically Signed   By: Amie Portlandavid  Ormond M.D.   On: 09/12/2015 12:53   Dg Hip Unilat W Or Wo Pelvis 2-3 Views Left  Result Date: 09/11/2015 CLINICAL DATA:  Pain with deformity EXAM: DG HIP (WITH OR WITHOUT PELVIS) 2-3V LEFT COMPARISON:  None. FINDINGS: There frontal pelvis as well as frontal and lateral left hip images were obtained. There is a comminuted intertrochanteric femur fracture on the left with varus angulation at the fracture site. There is avulsion of the lesser trochanter medially. No other fractures are evident. There is mild symmetric narrowing both hip joints. Bones are osteoporotic. No dislocation. There is extensive arterial vascular calcification at multiple sites. IMPRESSION: Comminuted intertrochanteric femur fracture on the left with varus angulation fracture slight avulsion lesser trochanter  medially. Bones osteoporotic. Mild narrowing both hip joints, symmetric. No dislocation. Extensive arterial vascular calcifications/atherosclerosis. Electronically Signed   By: Bretta BangWilliam  Woodruff III M.D.   On: 09/11/2015 11:45   Dg Femur Min 2 Views Left  Result Date: 09/12/2015 CLINICAL DATA:  Operative imaging during left proximal femur intertrochanteric fracture ORIF. EXAM: DG C-ARM 61-120 MIN; LEFT FEMUR 2 VIEWS COMPARISON:  None. FINDINGS: Images show placement along its measure rod to the left femur supporting a compression screw. The screw has reduces the major fracture components into near anatomic alignment. There is no new fracture or evidence of an operative complication. IMPRESSION: Well aligned major fracture components following left intertrochanteric fracture ORIF. Electronically Signed   By: Amie Portlandavid  Ormond M.D.   On: 09/12/2015 12:53   Dg Femur Port Min 2 Views Left  Result Date: 09/12/2015 CLINICAL DATA:  Postop IM nail EXAM: LEFT FEMUR PORTABLE 2 VIEWS COMPARISON:  None. FINDINGS: Placement of intra medullary nail and at and hip screw across the left intertrochanteric fracture. Two locking screws. No hardware complicating feature. Lesser trochanter remains displaced. IMPRESSION: Internal fixation across the left intertrochanteric fracture. No complicating feature. Electronically Signed   By: Charlett NoseKevin  Dover M.D.   On: 09/12/2015 14:24   ASSESSMENT AND PLAN:  Kelli Valenzuela  is a 82 y.o. female with a known history of Severe dementia, CAD status post CABG in last stenting was redder than 10 years ago, diabetes mellitus with recent admission to the hospital last week for  hypoglycemia presents to the hospital for elevated sugars today and incidentally noted to have left hip fracture.  #1 left hip fracture-communicative intertrochanteric fracture noted on the left. No fall identified. -Ortho consulted. Appreciate Dr Burna SisKrazinski's input. Patient did take Plavix yesterday -Moderate risk due to  her age, history of cardiovascular disease. No recent chest pain or dyspnea according to her daughter. -Her sugars reasonably better controlled and blood pressure improved prior to surgery. -Pain control -Physical therapy after the surgery as the goal is to get her up and moving.   #2 diabetes mellitus with hyperglycemia -A1c +7.8. Last week patient was hypoglycemic with changes in her Lantus. -Restart Lantus at lower dose, sliding scale insulin and monitor sugars closely.  #3 hypertension- hold lisinopril as blood pressure is low normal. Continue Coreg  #4 dementia with depression-continue her albuterol and. Patient seems to be at baseline. Very confused. No agitation or behavioral disturbance. Does have hallucinations at times -Watch for postoperative delirium  #5 CAD status post CABG-several years ago. Has been stable without any recent symptoms of angina, congestive heart failure or arrhythmia. No EKG changes.  #6 DVT prophylaxis per ortho . Started lovenox Case discussed with Care Management/Social Worker. Management plans discussed with the patient, family and they are in agreement.  TOTAL TIME TAKING CARE OF THIS PATIENT: 30 minutes.  >50% time spent on counselling and coordination of care dter  POSSIBLE D/C IN 2-3 DAYS, DEPENDING ON CLINICAL CONDITION.  Note: This dictation was prepared with Dragon dictation along with smaller phrase technology. Any transcriptional errors that result from this process are unintentional.  Italia Wolfert M.D on 09/12/2015 at 8:03 PM  Between 7am to 6pm - Pager - [phone]  After 6pm go to [email or web address] - password [email or web address]  Fabio Neighborsagle [facility]  Office  [phone]  CC: Primary care physician; Pcp Not In System

## 2015-09-12 NOTE — Progress Notes (Signed)
Subjective:  Patient seen in her hospital room this morning. Her daughter is at the bedside. Patient had confusion overnight and pulled out her IV. Patient has dementia and is unable to provide a history. Her nurse is replacing the IV currently.   Objective:   VITALS:   Vitals:   09/11/15 1725 09/11/15 2221 09/12/15 0400 09/12/15 0828  BP: 114/68 112/66 (!) 114/59 (!) 141/56  Pulse: (!) 104 100 89 96  Resp: 19 18 16 16   Temp: 98.1 F (36.7 C) 98.7 F (37.1 C) 97.7 F (36.5 C) 98.2 F (36.8 C)  TempSrc: Oral Oral Axillary   SpO2: 100% 100% 97% 97%    PHYSICAL EXAM:  Deferred due to nurse placing IV.   LABS  Results for orders placed or performed during the hospital encounter of 09/11/15 (from the past 24 hour(s))  Comprehensive metabolic panel     Status: Abnormal   Collection Time: 09/11/15 10:07 AM  Result Value Ref Range   Sodium 134 (L) 135 - 145 mmol/L   Potassium 5.1 3.5 - 5.1 mmol/L   Chloride 100 (L) 101 - 111 mmol/L   CO2 23 22 - 32 mmol/L   Glucose, Bld 444 (H) 65 - 99 mg/dL   BUN 37 (H) 6 - 20 mg/dL   Creatinine, Ser 9.600.92 0.44 - 1.00 mg/dL   Calcium 9.2 8.9 - 45.410.3 mg/dL   Total Protein 7.0 6.5 - 8.1 g/dL   Albumin 3.9 3.5 - 5.0 g/dL   AST 17 15 - 41 U/L   ALT 14 14 - 54 U/L   Alkaline Phosphatase 129 (H) 38 - 126 U/L   Total Bilirubin 0.8 0.3 - 1.2 mg/dL   GFR calc non Af Amer 54 (L) >60 mL/min   GFR calc Af Amer >60 >60 mL/min   Anion gap 11 5 - 15  Troponin I     Status: None   Collection Time: 09/11/15 10:07 AM  Result Value Ref Range   Troponin I <0.03 <0.03 ng/mL  CBC with Differential     Status: Abnormal   Collection Time: 09/11/15 10:07 AM  Result Value Ref Range   WBC 14.0 (H) 3.6 - 11.0 K/uL   RBC 3.81 3.80 - 5.20 MIL/uL   Hemoglobin 11.3 (L) 12.0 - 16.0 g/dL   HCT 09.833.5 (L) 11.935.0 - 14.747.0 %   MCV 87.9 80.0 - 100.0 fL   MCH 29.6 26.0 - 34.0 pg   MCHC 33.7 32.0 - 36.0 g/dL   RDW 82.914.3 56.211.5 - 13.014.5 %   Platelets 251 150 - 440 K/uL    Neutrophils Relative % 89 %   Neutro Abs 12.5 (H) 1.4 - 6.5 K/uL   Lymphocytes Relative 6 %   Lymphs Abs 0.8 (L) 1.0 - 3.6 K/uL   Monocytes Relative 5 %   Monocytes Absolute 0.7 0.2 - 0.9 K/uL   Eosinophils Relative 0 %   Eosinophils Absolute 0.0 0 - 0.7 K/uL   Basophils Relative 0 %   Basophils Absolute 0.0 0 - 0.1 K/uL  Hemoglobin A1c     Status: Abnormal   Collection Time: 09/11/15 10:07 AM  Result Value Ref Range   Hgb A1c MFr Bld 7.5 (H) 4.0 - 6.0 %  Type and screen If not already done in ED     Status: None   Collection Time: 09/11/15 10:07 AM  Result Value Ref Range   ABO/RH(D) B POS    Antibody Screen NEG    Sample Expiration 09/14/2015  Urinalysis complete, with microscopic     Status: Abnormal   Collection Time: 09/11/15 11:42 AM  Result Value Ref Range   Color, Urine YELLOW (A) YELLOW   APPearance CLEAR (A) CLEAR   Glucose, UA >500 (A) NEGATIVE mg/dL   Bilirubin Urine NEGATIVE NEGATIVE   Ketones, ur 1+ (A) NEGATIVE mg/dL   Specific Gravity, Urine 1.024 1.005 - 1.030   Hgb urine dipstick NEGATIVE NEGATIVE   pH 5.0 5.0 - 8.0   Protein, ur NEGATIVE NEGATIVE mg/dL   Nitrite NEGATIVE NEGATIVE   Leukocytes, UA NEGATIVE NEGATIVE   RBC / HPF 0-5 0 - 5 RBC/hpf   WBC, UA 0-5 0 - 5 WBC/hpf   Bacteria, UA NONE SEEN NONE SEEN   Squamous Epithelial / LPF 0-5 (A) NONE SEEN  Glucose, capillary     Status: Abnormal   Collection Time: 09/11/15  3:10 PM  Result Value Ref Range   Glucose-Capillary 384 (H) 65 - 99 mg/dL  Glucose, capillary     Status: Abnormal   Collection Time: 09/11/15  9:44 PM  Result Value Ref Range   Glucose-Capillary 316 (H) 65 - 99 mg/dL  Glucose, capillary     Status: Abnormal   Collection Time: 09/11/15  9:45 PM  Result Value Ref Range   Glucose-Capillary 312 (H) 65 - 99 mg/dL  Protime-INR     Status: None   Collection Time: 09/11/15 10:27 PM  Result Value Ref Range   Prothrombin Time 13.0 11.4 - 15.2 seconds   INR 0.98   CBC WITH DIFFERENTIAL      Status: Abnormal   Collection Time: 09/11/15 10:27 PM  Result Value Ref Range   WBC 11.5 (H) 3.6 - 11.0 K/uL   RBC 3.56 (L) 3.80 - 5.20 MIL/uL   Hemoglobin 10.3 (L) 12.0 - 16.0 g/dL   HCT 16.1 (L) 09.6 - 04.5 %   MCV 86.7 80.0 - 100.0 fL   MCH 28.9 26.0 - 34.0 pg   MCHC 33.3 32.0 - 36.0 g/dL   RDW 40.9 81.1 - 91.4 %   Platelets 253 150 - 440 K/uL   Neutrophils Relative % 78 %   Neutro Abs 9.0 (H) 1.4 - 6.5 K/uL   Lymphocytes Relative 14 %   Lymphs Abs 1.6 1.0 - 3.6 K/uL   Monocytes Relative 8 %   Monocytes Absolute 0.9 0.2 - 0.9 K/uL   Eosinophils Relative 0 %   Eosinophils Absolute 0.0 0 - 0.7 K/uL   Basophils Relative 0 %   Basophils Absolute 0.0 0 - 0.1 K/uL  Comprehensive metabolic panel     Status: Abnormal   Collection Time: 09/11/15 10:27 PM  Result Value Ref Range   Sodium 135 135 - 145 mmol/L   Potassium 4.2 3.5 - 5.1 mmol/L   Chloride 101 101 - 111 mmol/L   CO2 21 (L) 22 - 32 mmol/L   Glucose, Bld 316 (H) 65 - 99 mg/dL   BUN 42 (H) 6 - 20 mg/dL   Creatinine, Ser 7.82 (H) 0.44 - 1.00 mg/dL   Calcium 9.0 8.9 - 95.6 mg/dL   Total Protein 6.7 6.5 - 8.1 g/dL   Albumin 3.9 3.5 - 5.0 g/dL   AST 15 15 - 41 U/L   ALT 12 (L) 14 - 54 U/L   Alkaline Phosphatase 110 38 - 126 U/L   Total Bilirubin 0.8 0.3 - 1.2 mg/dL   GFR calc non Af Amer 48 (L) >60 mL/min   GFR calc Af Amer 56 (  L) >60 mL/min   Anion gap 13 5 - 15  APTT     Status: None   Collection Time: 09/11/15 10:27 PM  Result Value Ref Range   aPTT 25 24 - 36 seconds  Basic metabolic panel     Status: Abnormal   Collection Time: 09/12/15  6:36 AM  Result Value Ref Range   Sodium 135 135 - 145 mmol/L   Potassium 4.1 3.5 - 5.1 mmol/L   Chloride 103 101 - 111 mmol/L   CO2 25 22 - 32 mmol/L   Glucose, Bld 270 (H) 65 - 99 mg/dL   BUN 47 (H) 6 - 20 mg/dL   Creatinine, Ser 1.61 (H) 0.44 - 1.00 mg/dL   Calcium 8.6 (L) 8.9 - 10.3 mg/dL   GFR calc non Af Amer 38 (L) >60 mL/min   GFR calc Af Amer 44 (L) >60 mL/min    Anion gap 7 5 - 15  CBC     Status: Abnormal   Collection Time: 09/12/15  6:36 AM  Result Value Ref Range   WBC 10.5 3.6 - 11.0 K/uL   RBC 3.30 (L) 3.80 - 5.20 MIL/uL   Hemoglobin 9.7 (L) 12.0 - 16.0 g/dL   HCT 09.6 (L) 04.5 - 40.9 %   MCV 87.1 80.0 - 100.0 fL   MCH 29.5 26.0 - 34.0 pg   MCHC 33.9 32.0 - 36.0 g/dL   RDW 81.1 91.4 - 78.2 %   Platelets 223 150 - 440 K/uL  Glucose, capillary     Status: Abnormal   Collection Time: 09/12/15  8:09 AM  Result Value Ref Range   Glucose-Capillary 319 (H) 65 - 99 mg/dL    Dg Chest 2 View  Result Date: 09/11/2015 CLINICAL DATA:  Generalized weakness EXAM: CHEST  2 VIEW COMPARISON:  August 08, 2015 FINDINGS: There is no edema or consolidation. The heart size and pulmonary vascularity are normal. No adenopathy. There is atherosclerotic calcification in the aorta. The patient is status post coronary artery bypass grafting. Bones are osteoporotic. There is a total shoulder replacement on the left. There is calcification in the left carotid artery. IMPRESSION: No edema or consolidation. Areas of aortic atherosclerosis. There is also left carotid artery calcification. Bones osteoporotic. Electronically Signed   By: Bretta Bang III M.D.   On: 09/11/2015 11:44   Ct Head Wo Contrast  Result Date: 09/11/2015 CLINICAL DATA:  Generalized weakness in not feeling well. EXAM: CT HEAD WITHOUT CONTRAST TECHNIQUE: Contiguous axial images were obtained from the base of the skull through the vertex without intravenous contrast. COMPARISON:  CT head without contrast 08/08/2015. FINDINGS: Brain: Moderate generalized atrophy and diffuse white matter disease is again noted. No acute infarct, hemorrhage, or mass lesion is present. Vascular: Atherosclerotic calcifications are present within the cavernous internal carotid arteries and at the dural margin of the vertebral arteries bilaterally. Skull: The calvarium is intact. Sinuses/Orbits: The visualized paranasal  sinuses are clear. Bilateral lens replacements are present. The globes and orbits are otherwise intact. Other: No significant extracranial soft tissue lesions are evident. IMPRESSION: 1. No acute intracranial abnormality or significant interval change. 2. Stable moderate atrophy and white matter disease. Electronically Signed   By: Marin Roberts M.D.   On: 09/11/2015 17:01   Dg Hip Unilat W Or Wo Pelvis 2-3 Views Left  Result Date: 09/11/2015 CLINICAL DATA:  Pain with deformity EXAM: DG HIP (WITH OR WITHOUT PELVIS) 2-3V LEFT COMPARISON:  None. FINDINGS: There frontal pelvis as well as  frontal and lateral left hip images were obtained. There is a comminuted intertrochanteric femur fracture on the left with varus angulation at the fracture site. There is avulsion of the lesser trochanter medially. No other fractures are evident. There is mild symmetric narrowing both hip joints. Bones are osteoporotic. No dislocation. There is extensive arterial vascular calcification at multiple sites. IMPRESSION: Comminuted intertrochanteric femur fracture on the left with varus angulation fracture slight avulsion lesser trochanter medially. Bones osteoporotic. Mild narrowing both hip joints, symmetric. No dislocation. Extensive arterial vascular calcifications/atherosclerosis. Electronically Signed   By: Bretta BangWilliam  Woodruff III M.D.   On: 09/11/2015 11:45    Assessment/Plan: Day of Surgery   Active Problems:   Hip fracture (HCC)  Patient's blood sugars have improved.  BP is stable.  I spoken with Dr. Allena KatzPatel from the hospitalist service. Patient is cleared medically for surgery. I also spoke with Dr. Pernell DupreAdams from anesthesia service who is comfortable with proceeding with surgery. I have also spoke with the patient's daughter this morning. She is in agreement with the plan for surgery. I have answered all her questions. Patient's surgical site is marked.    Juanell FairlyKRASINSKI, Joselle Deeds , MD 09/12/2015, 10:02 AM

## 2015-09-12 NOTE — Anesthesia Preprocedure Evaluation (Signed)
Anesthesia Evaluation  Patient identified by MRN, date of birth, ID band Patient awake    Reviewed: Allergy & Precautions, H&P , NPO status , Patient's Chart, lab work & pertinent test results, reviewed documented beta blocker date and time   Airway Mallampati: II  TM Distance: >3 FB Neck ROM: full    Dental  (+) Teeth Intact   Pulmonary neg pulmonary ROS,    Pulmonary exam normal        Cardiovascular Exercise Tolerance: Good + CAD  negative cardio ROS Normal cardiovascular exam Rate:Normal     Neuro/Psych negative neurological ROS  negative psych ROS   GI/Hepatic negative GI ROS, Neg liver ROS,   Endo/Other  negative endocrine ROSdiabetes  Renal/GU negative Renal ROS  negative genitourinary   Musculoskeletal   Abdominal   Peds  Hematology negative hematology ROS (+)   Anesthesia Other Findings   Reproductive/Obstetrics negative OB ROS                             Anesthesia Physical Anesthesia Plan  ASA: III and emergent  Anesthesia Plan: General LMA   Post-op Pain Management:    Induction:   Airway Management Planned:   Additional Equipment:   Intra-op Plan:   Post-operative Plan:   Informed Consent: I have reviewed the patients History and Physical, chart, labs and discussed the procedure including the risks, benefits and alternatives for the proposed anesthesia with the patient or authorized representative who has indicated his/her understanding and acceptance.     Plan Discussed with: CRNA  Anesthesia Plan Comments:         Anesthesia Quick Evaluation

## 2015-09-12 NOTE — Anesthesia Postprocedure Evaluation (Signed)
Anesthesia Post Note  Patient: Kelli Valenzuela  Procedure(s) Performed: Procedure(s) (LRB): INTRAMEDULLARY (IM) NAIL INTERTROCHANTRIC (Left)  Patient location during evaluation: PACU Anesthesia Type: General Level of consciousness: awake and alert Pain management: pain level controlled Vital Signs Assessment: post-procedure vital signs reviewed and stable Respiratory status: spontaneous breathing, nonlabored ventilation, respiratory function stable and patient connected to nasal cannula oxygen Cardiovascular status: blood pressure returned to baseline and stable Postop Assessment: no signs of nausea or vomiting Anesthetic complications: no    Last Vitals:  Vitals:   09/12/15 1248 09/12/15 1251  BP: (!) 143/73 (!) 143/73  Pulse: 83 83  Resp: 15 18  Temp: 36.9 C 36.9 C    Last Pain:  Vitals:   09/12/15 1251  TempSrc: Temporal  PainSc:                  Yevette EdwardsJames G Keshan Reha

## 2015-09-12 NOTE — Anesthesia Postprocedure Evaluation (Deleted)
Anesthesia Post Note  Patient: Kelli Valenzuela  Procedure(s) Performed: Procedure(s) (LRB): INTRAMEDULLARY (IM) NAIL INTERTROCHANTRIC (Left)  Anesthesia Post Evaluation  Last Vitals:  Vitals:   09/12/15 1248 09/12/15 1251  BP: (!) 143/73 (!) 143/73  Pulse: 83 83  Resp: 15 18  Temp: 36.9 C 36.9 C    Last Pain:  Vitals:   09/12/15 1251  TempSrc: Temporal  PainSc:                  [name]

## 2015-09-12 NOTE — Care Management (Signed)
Patient presented from the memory unit at assisted living [facility].  She is admitted with hip fracture requiring surgical intervention.  It is anticipated patient will require skilled nursing placement.  CSW is involved

## 2015-09-12 NOTE — Clinical Social Work Placement (Signed)
   CLINICAL SOCIAL WORK PLACEMENT  NOTE  Date:  09/12/2015  Patient Details  Name: Kelli Valenzuela MRN: [id number] Date of Birth: 08/09/1925  Clinical Social Work is seeking post-discharge placement for this patient at the Skilled  Nursing Facility level of care (*CSW will initial, date and re-position this form in  chart as items are completed):  Yes   Patient/family provided with [place] Clinical Social Work Department's list of facilities offering this level of care within the geographic area requested by the patient (or if unable, by the patient's family).  Yes   Patient/family informed of their freedom to choose among providers that offer the needed level of care, that participate in Medicare, Medicaid or managed care program needed by the patient, have an available bed and are willing to accept the patient.  Yes   Patient/family informed of 's ownership interest [facility] and [facility], as well as of the fact that they are under no obligation to receive care at these facilities.  PASRR submitted to EDS on 09/12/15     PASRR number received on 09/12/15     Existing PASRR number confirmed on       FL2 transmitted to all facilities in geographic area requested by pt/family on 09/12/15     FL2 transmitted to all facilities within larger geographic area on       Patient informed that his/her managed care company has contracts with or will negotiate with certain facilities, including the following:            Patient/family informed of bed offers received.  Patient chooses bed at       Physician recommends and patient chooses bed at      Patient to be transferred to   on  .  Patient to be transferred to facility by       Patient family notified on   of transfer.  Name of family member notified:        PHYSICIAN       Additional Comment:    _______________________________________________ [name], Darleen CrockerBailey M, LCSW 09/12/2015, 11:02 AM

## 2015-09-12 NOTE — Progress Notes (Signed)
Subjective:  POST OP CHECK:  Patient with dementia and unable to provide a history.  Daughter is at the bedside.  Patient just received morphine but appears restless in bed.   Objective:   VITALS:   Vitals:   09/12/15 1333 09/12/15 1348 09/12/15 1410 09/12/15 1454  BP: (!) 133/52 140/69 136/61 137/63  Pulse: 87 92 88 96  Resp: (!) 21 19 14 18   Temp:   98 F (36.7 C) 97.6 F (36.4 C)  TempSrc:   Oral Axillary  SpO2: 92% 93% 98% 100%  Weight:      Height:        PHYSICAL EXAM:  Left lower extremity:  Dressing over patient's proximal hip incision is saturated but has been reinforced.  Distal two incisions are C/D/I.  Can't assess sensation.  Spontaneous movement of left lower extremity.   LABS  Results for orders placed or performed during the hospital encounter of 09/11/15 (from the past 24 hour(s))  Glucose, capillary     Status: Abnormal   Collection Time: 09/11/15  9:44 PM  Result Value Ref Range   Glucose-Capillary 316 (H) 65 - 99 mg/dL  Glucose, capillary     Status: Abnormal   Collection Time: 09/11/15  9:45 PM  Result Value Ref Range   Glucose-Capillary 312 (H) 65 - 99 mg/dL  Protime-INR     Status: None   Collection Time: 09/11/15 10:27 PM  Result Value Ref Range   Prothrombin Time 13.0 11.4 - 15.2 seconds   INR 0.98   CBC WITH DIFFERENTIAL     Status: Abnormal   Collection Time: 09/11/15 10:27 PM  Result Value Ref Range   WBC 11.5 (H) 3.6 - 11.0 K/uL   RBC 3.56 (L) 3.80 - 5.20 MIL/uL   Hemoglobin 10.3 (L) 12.0 - 16.0 g/dL   HCT 60.130.9 (L) 09.335.0 - 23.547.0 %   MCV 86.7 80.0 - 100.0 fL   MCH 28.9 26.0 - 34.0 pg   MCHC 33.3 32.0 - 36.0 g/dL   RDW 57.314.1 22.011.5 - 25.414.5 %   Platelets 253 150 - 440 K/uL   Neutrophils Relative % 78 %   Neutro Abs 9.0 (H) 1.4 - 6.5 K/uL   Lymphocytes Relative 14 %   Lymphs Abs 1.6 1.0 - 3.6 K/uL   Monocytes Relative 8 %   Monocytes Absolute 0.9 0.2 - 0.9 K/uL   Eosinophils Relative 0 %   Eosinophils Absolute 0.0 0 - 0.7 K/uL    Basophils Relative 0 %   Basophils Absolute 0.0 0 - 0.1 K/uL  Comprehensive metabolic panel     Status: Abnormal   Collection Time: 09/11/15 10:27 PM  Result Value Ref Range   Sodium 135 135 - 145 mmol/L   Potassium 4.2 3.5 - 5.1 mmol/L   Chloride 101 101 - 111 mmol/L   CO2 21 (L) 22 - 32 mmol/L   Glucose, Bld 316 (H) 65 - 99 mg/dL   BUN 42 (H) 6 - 20 mg/dL   Creatinine, Ser 2.701.02 (H) 0.44 - 1.00 mg/dL   Calcium 9.0 8.9 - 62.310.3 mg/dL   Total Protein 6.7 6.5 - 8.1 g/dL   Albumin 3.9 3.5 - 5.0 g/dL   AST 15 15 - 41 U/L   ALT 12 (L) 14 - 54 U/L   Alkaline Phosphatase 110 38 - 126 U/L   Total Bilirubin 0.8 0.3 - 1.2 mg/dL   GFR calc non Af Amer 48 (L) >60 mL/min   GFR calc Af Amer 56 (  L) >60 mL/min   Anion gap 13 5 - 15  APTT     Status: None   Collection Time: 09/11/15 10:27 PM  Result Value Ref Range   aPTT 25 24 - 36 seconds  Basic metabolic panel     Status: Abnormal   Collection Time: 09/12/15  6:36 AM  Result Value Ref Range   Sodium 135 135 - 145 mmol/L   Potassium 4.1 3.5 - 5.1 mmol/L   Chloride 103 101 - 111 mmol/L   CO2 25 22 - 32 mmol/L   Glucose, Bld 270 (H) 65 - 99 mg/dL   BUN 47 (H) 6 - 20 mg/dL   Creatinine, Ser 1.61 (H) 0.44 - 1.00 mg/dL   Calcium 8.6 (L) 8.9 - 10.3 mg/dL   GFR calc non Af Amer 38 (L) >60 mL/min   GFR calc Af Amer 44 (L) >60 mL/min   Anion gap 7 5 - 15  CBC     Status: Abnormal   Collection Time: 09/12/15  6:36 AM  Result Value Ref Range   WBC 10.5 3.6 - 11.0 K/uL   RBC 3.30 (L) 3.80 - 5.20 MIL/uL   Hemoglobin 9.7 (L) 12.0 - 16.0 g/dL   HCT 09.6 (L) 04.5 - 40.9 %   MCV 87.1 80.0 - 100.0 fL   MCH 29.5 26.0 - 34.0 pg   MCHC 33.9 32.0 - 36.0 g/dL   RDW 81.1 91.4 - 78.2 %   Platelets 223 150 - 440 K/uL  Glucose, capillary     Status: Abnormal   Collection Time: 09/12/15  8:09 AM  Result Value Ref Range   Glucose-Capillary 319 (H) 65 - 99 mg/dL  Glucose, capillary     Status: Abnormal   Collection Time: 09/12/15 10:33 AM  Result Value  Ref Range   Glucose-Capillary 227 (H) 65 - 99 mg/dL  Glucose, capillary     Status: Abnormal   Collection Time: 09/12/15 12:50 PM  Result Value Ref Range   Glucose-Capillary 173 (H) 65 - 99 mg/dL    Dg Chest 2 View  Result Date: 09/11/2015 CLINICAL DATA:  Generalized weakness EXAM: CHEST  2 VIEW COMPARISON:  August 08, 2015 FINDINGS: There is no edema or consolidation. The heart size and pulmonary vascularity are normal. No adenopathy. There is atherosclerotic calcification in the aorta. The patient is status post coronary artery bypass grafting. Bones are osteoporotic. There is a total shoulder replacement on the left. There is calcification in the left carotid artery. IMPRESSION: No edema or consolidation. Areas of aortic atherosclerosis. There is also left carotid artery calcification. Bones osteoporotic. Electronically Signed   By: Bretta Bang III M.D.   On: 09/11/2015 11:44   Ct Head Wo Contrast  Result Date: 09/11/2015 CLINICAL DATA:  Generalized weakness in not feeling well. EXAM: CT HEAD WITHOUT CONTRAST TECHNIQUE: Contiguous axial images were obtained from the base of the skull through the vertex without intravenous contrast. COMPARISON:  CT head without contrast 08/08/2015. FINDINGS: Brain: Moderate generalized atrophy and diffuse white matter disease is again noted. No acute infarct, hemorrhage, or mass lesion is present. Vascular: Atherosclerotic calcifications are present within the cavernous internal carotid arteries and at the dural margin of the vertebral arteries bilaterally. Skull: The calvarium is intact. Sinuses/Orbits: The visualized paranasal sinuses are clear. Bilateral lens replacements are present. The globes and orbits are otherwise intact. Other: No significant extracranial soft tissue lesions are evident. IMPRESSION: 1. No acute intracranial abnormality or significant interval change. 2. Stable moderate atrophy  and white matter disease. Electronically Signed   By:  Marin Robertshristopher  Mattern M.D.   On: 09/11/2015 17:01   Dg Pelvis Portable  Result Date: 09/12/2015 CLINICAL DATA:  Postop IM nail EXAM: PORTABLE PELVIS 1-2 VIEWS COMPARISON:  09/11/2015 FINDINGS: Interval placement of intra medullary nail and dynamic hip screw across the left femoral intertrochanteric fracture. Lesser trochanter remains displaced. Otherwise near anatomic alignment. No subluxation or dislocation. No visible hardware complicating feature. IMPRESSION: Internal fixation across the left intertrochanteric fracture. No complicating features visualized. Electronically Signed   By: Charlett NoseKevin  Dover M.D.   On: 09/12/2015 14:23   Dg C-arm 1-60 Min  Result Date: 09/12/2015 CLINICAL DATA:  Operative imaging during left proximal femur intertrochanteric fracture ORIF. EXAM: DG C-ARM 61-120 MIN; LEFT FEMUR 2 VIEWS COMPARISON:  None. FINDINGS: Images show placement along its measure rod to the left femur supporting a compression screw. The screw has reduces the major fracture components into near anatomic alignment. There is no new fracture or evidence of an operative complication. IMPRESSION: Well aligned major fracture components following left intertrochanteric fracture ORIF. Electronically Signed   By: Amie Portlandavid  Ormond M.D.   On: 09/12/2015 12:53   Dg Hip Unilat W Or Wo Pelvis 2-3 Views Left  Result Date: 09/11/2015 CLINICAL DATA:  Pain with deformity EXAM: DG HIP (WITH OR WITHOUT PELVIS) 2-3V LEFT COMPARISON:  None. FINDINGS: There frontal pelvis as well as frontal and lateral left hip images were obtained. There is a comminuted intertrochanteric femur fracture on the left with varus angulation at the fracture site. There is avulsion of the lesser trochanter medially. No other fractures are evident. There is mild symmetric narrowing both hip joints. Bones are osteoporotic. No dislocation. There is extensive arterial vascular calcification at multiple sites. IMPRESSION: Comminuted intertrochanteric femur  fracture on the left with varus angulation fracture slight avulsion lesser trochanter medially. Bones osteoporotic. Mild narrowing both hip joints, symmetric. No dislocation. Extensive arterial vascular calcifications/atherosclerosis. Electronically Signed   By: Bretta BangWilliam  Woodruff III M.D.   On: 09/11/2015 11:45   Dg Femur Min 2 Views Left  Result Date: 09/12/2015 CLINICAL DATA:  Operative imaging during left proximal femur intertrochanteric fracture ORIF. EXAM: DG C-ARM 61-120 MIN; LEFT FEMUR 2 VIEWS COMPARISON:  None. FINDINGS: Images show placement along its measure rod to the left femur supporting a compression screw. The screw has reduces the major fracture components into near anatomic alignment. There is no new fracture or evidence of an operative complication. IMPRESSION: Well aligned major fracture components following left intertrochanteric fracture ORIF. Electronically Signed   By: Amie Portlandavid  Ormond M.D.   On: 09/12/2015 12:53   Dg Femur Port Min 2 Views Left  Result Date: 09/12/2015 CLINICAL DATA:  Postop IM nail EXAM: LEFT FEMUR PORTABLE 2 VIEWS COMPARISON:  None. FINDINGS: Placement of intra medullary nail and at and hip screw across the left intertrochanteric fracture. Two locking screws. No hardware complicating feature. Lesser trochanter remains displaced. IMPRESSION: Internal fixation across the left intertrochanteric fracture. No complicating feature. Electronically Signed   By: Charlett NoseKevin  Dover M.D.   On: 09/12/2015 14:24    Assessment/Plan: Day of Surgery   Active Problems:   Hip fracture [facility](HCC)  Patient stable post-op.  Showing possible signs of sun-downing.  Will defer to medicine regarding treatment for this.  I have reviewed post-op xrays.  Fracture well reduced and hardware well positioned.  Complete 24 hours of post-op antibiotics.  Restart plavix and ecasa tomorrow.    Juanell FairlyKRASINSKI, Breton Berns , MD 09/12/2015, 4:20 PM

## 2015-09-13 ENCOUNTER — Encounter: Payer: Self-pay | Admitting: Orthopedic Surgery

## 2015-09-13 LAB — BASIC METABOLIC PANEL
Anion gap: 6 (ref 5–15)
BUN: 33 mg/dL — AB (ref 6–20)
CHLORIDE: 106 mmol/L (ref 101–111)
CO2: 26 mmol/L (ref 22–32)
CREATININE: 0.71 mg/dL (ref 0.44–1.00)
Calcium: 8 mg/dL — ABNORMAL LOW (ref 8.9–10.3)
Glucose, Bld: 212 mg/dL — ABNORMAL HIGH (ref 65–99)
POTASSIUM: 3.7 mmol/L (ref 3.5–5.1)
SODIUM: 138 mmol/L (ref 135–145)

## 2015-09-13 LAB — CBC
HCT: 22.9 % — ABNORMAL LOW (ref 35.0–47.0)
HEMOGLOBIN: 7.9 g/dL — AB (ref 12.0–16.0)
MCH: 30 pg (ref 26.0–34.0)
MCHC: 34.4 g/dL (ref 32.0–36.0)
MCV: 87.3 fL (ref 80.0–100.0)
PLATELETS: 188 10*3/uL (ref 150–440)
RBC: 2.62 MIL/uL — AB (ref 3.80–5.20)
RDW: 14.3 % (ref 11.5–14.5)
WBC: 8.6 10*3/uL (ref 3.6–11.0)

## 2015-09-13 LAB — GLUCOSE, CAPILLARY
GLUCOSE-CAPILLARY: 218 mg/dL — AB (ref 65–99)
GLUCOSE-CAPILLARY: 218 mg/dL — AB (ref 65–99)
Glucose-Capillary: 93 mg/dL (ref 65–99)
Glucose-Capillary: 91 mg/dL (ref 65–99)

## 2015-09-13 LAB — URINE CULTURE: CULTURE: NO GROWTH

## 2015-09-13 LAB — ABO/RH: ABO/RH(D): B POS

## 2015-09-13 LAB — PREPARE RBC (CROSSMATCH)

## 2015-09-13 LAB — VITAMIN D 25 HYDROXY (VIT D DEFICIENCY, FRACTURES): VIT D 25 HYDROXY: 8.3 ng/mL — AB (ref 30.0–100.0)

## 2015-09-13 MED ORDER — ENOXAPARIN SODIUM 40 MG/0.4ML ~~LOC~~ SOLN
40.0000 mg | SUBCUTANEOUS | Status: DC
  Administered 2015-09-13: 40 mg via SUBCUTANEOUS
  Filled 2015-09-13: qty 0.4

## 2015-09-13 MED ORDER — SODIUM CHLORIDE 0.9 % IV SOLN
Freq: Once | INTRAVENOUS | Status: AC
  Administered 2015-09-13: 10:00:00 via INTRAVENOUS

## 2015-09-13 MED ORDER — ACETAMINOPHEN 325 MG PO TABS
650.0000 mg | ORAL_TABLET | Freq: Once | ORAL | Status: AC
  Administered 2015-09-13: 650 mg via ORAL
  Filled 2015-09-13: qty 2

## 2015-09-13 MED ORDER — INSULIN GLARGINE 100 UNIT/ML ~~LOC~~ SOLN
18.0000 [IU] | Freq: Every day | SUBCUTANEOUS | Status: DC
  Administered 2015-09-13: 18 [IU] via SUBCUTANEOUS
  Filled 2015-09-13 (×2): qty 0.18

## 2015-09-13 MED ORDER — INSULIN GLARGINE 100 UNIT/ML ~~LOC~~ SOLN
20.0000 [IU] | Freq: Every day | SUBCUTANEOUS | Status: DC
  Filled 2015-09-13: qty 0.2

## 2015-09-13 MED ORDER — INSULIN ASPART 100 UNIT/ML ~~LOC~~ SOLN
3.0000 [IU] | Freq: Three times a day (TID) | SUBCUTANEOUS | Status: DC
  Administered 2015-09-13 (×2): 3 [IU] via SUBCUTANEOUS
  Filled 2015-09-13 (×2): qty 3

## 2015-09-13 MED ORDER — DIPHENHYDRAMINE HCL 25 MG PO CAPS
25.0000 mg | ORAL_CAPSULE | Freq: Once | ORAL | Status: AC
  Administered 2015-09-13: 25 mg via ORAL
  Filled 2015-09-13: qty 1

## 2015-09-13 NOTE — Progress Notes (Signed)
[facility] - [place] at [facility]   PATIENT NAME: Kelli Valenzuela    MR#:  [id number]  DATE OF BIRTH:  07/20/1925  SUBJECTIVE:  Came in after pt was found to have AMS with sugars in the 400 Per daughter very brittle diabetes for 25 years Incidental finding of left hip fracture. No fall documented at the facility  REVIEW OF SYSTEMS:   Review of Systems  Unable to perform ROS: Dementia   Tolerating Diet:yes Tolerating PT: pending  DRUG ALLERGIES:   Allergies  Allergen Reactions  . Sulfa Antibiotics Hives    VITALS:  Blood pressure (!) 108/38, pulse 83, temperature 98.7 F (37.1 C), temperature source Axillary, resp. rate 18, height 5\' 4"  (1.626 m), weight 134 lb (60.8 kg), SpO2 99 %.  PHYSICAL EXAMINATION:   Physical Exam  GENERAL:  82 y.o.-year-old patient lying in the bed with no acute distress.  EYES: Pupils equal, round, reactive to light and accommodation. No scleral icterus. Extraocular muscles intact.  HEENT: Head atraumatic, normocephalic. Oropharynx and nasopharynx clear.  NECK:  Supple, no jugular venous distention. No thyroid enlargement, no tenderness.  LUNGS: Normal breath sounds bilaterally, no wheezing, rales, rhonchi. No use of accessory muscles of respiration.  CARDIOVASCULAR: S1, S2 normal. No murmurs, rubs, or gallops.  ABDOMEN: Soft, nontender, nondistended. Bowel sounds present. No organomegaly or mass.  EXTREMITIES: No cyanosis, clubbing or edema b/l.    NEUROLOGIC: grossly non focal, chronic dementia PSYCHIATRIC:  patient is alert and oriented x 3.  SKIN: No obvious rash, lesion, or ulcer.   LABORATORY PANEL:  CBC  Recent Labs Lab 09/13/15 0401  WBC 8.6  HGB 7.9*  HCT 22.9*  PLT 188    Chemistries   Recent Labs Lab 09/11/15 2227  09/13/15 0401  NA 135  < > 138  K 4.2  < > 3.7  CL 101  < > 106  CO2 21*  < > 26  GLUCOSE 316*  < > 212*  BUN 42*  < > 33*  CREATININE 1.02*  < > 0.71  CALCIUM 9.0  < > 8.0*   AST 15  --   --   ALT 12*  --   --   ALKPHOS 110  --   --   BILITOT 0.8  --   --   < > = values in this interval not displayed. Cardiac Enzymes  Recent Labs Lab 09/11/15 1007  TROPONINI <0.03   RADIOLOGY:  Dg Pelvis Portable  Result Date: 09/12/2015 CLINICAL DATA:  Postop IM nail EXAM: PORTABLE PELVIS 1-2 VIEWS COMPARISON:  09/11/2015 FINDINGS: Interval placement of intra medullary nail and dynamic hip screw across the left femoral intertrochanteric fracture. Lesser trochanter remains displaced. Otherwise near anatomic alignment. No subluxation or dislocation. No visible hardware complicating feature. IMPRESSION: Internal fixation across the left intertrochanteric fracture. No complicating features visualized. Electronically Signed   By: Charlett NoseKevin  Dover M.D.   On: 09/12/2015 14:23   Dg C-arm 1-60 Min  Result Date: 09/12/2015 CLINICAL DATA:  Operative imaging during left proximal femur intertrochanteric fracture ORIF. EXAM: DG C-ARM 61-120 MIN; LEFT FEMUR 2 VIEWS COMPARISON:  None. FINDINGS: Images show placement along its measure rod to the left femur supporting a compression screw. The screw has reduces the major fracture components into near anatomic alignment. There is no new fracture or evidence of an operative complication. IMPRESSION: Well aligned major fracture components following left intertrochanteric fracture ORIF. Electronically Signed   By: Amie Portlandavid  Ormond M.D.   On:  09/12/2015 12:53   Dg Femur Min 2 Views Left  Result Date: 09/12/2015 CLINICAL DATA:  Operative imaging during left proximal femur intertrochanteric fracture ORIF. EXAM: DG C-ARM 61-120 MIN; LEFT FEMUR 2 VIEWS COMPARISON:  None. FINDINGS: Images show placement along its measure rod to the left femur supporting a compression screw. The screw has reduces the major fracture components into near anatomic alignment. There is no new fracture or evidence of an operative complication. IMPRESSION: Well aligned major fracture  components following left intertrochanteric fracture ORIF. Electronically Signed   By: Amie Portlandavid  Ormond M.D.   On: 09/12/2015 12:53   Dg Femur Port Min 2 Views Left  Result Date: 09/12/2015 CLINICAL DATA:  Postop IM nail EXAM: LEFT FEMUR PORTABLE 2 VIEWS COMPARISON:  None. FINDINGS: Placement of intra medullary nail and at and hip screw across the left intertrochanteric fracture. Two locking screws. No hardware complicating feature. Lesser trochanter remains displaced. IMPRESSION: Internal fixation across the left intertrochanteric fracture. No complicating feature. Electronically Signed   By: Charlett NoseKevin  Dover M.D.   On: 09/12/2015 14:24   ASSESSMENT AND PLAN:  Kelli Valenzuela  is a 82 y.o. female with a known history of Severe dementia, CAD status post CABG in last stenting was redder than 10 years ago, diabetes mellitus with recent admission to the hospital last week for hypoglycemia presents to the hospital for elevated sugars today and incidentally noted to have left hip fracture.  #1 left hip fracture-communicative intertrochanteric fracture noted on the left. No fall identified. -Ortho consulted. Appreciate Dr Burna SisKrazinski's input. -POD#1 -Pain control -Physical therapy recommends SNF -got 1 unit BT for post-op anemia  #2 diabetes mellitus with hyperglycemia -A1c +7.8. Last week patient was hypoglycemic with changes in her Lantus. -Restart Lantus at lower dose, sliding scale insulin and monitor sugars closely.  #3 hypertension- hold lisinopril as blood pressure is low normal. Continue Coreg  #4 dementia with depression-continue her albuterol and. Patient seems to be at baseline. - No agitation or behavioral disturbance. Does have hallucinations at times -Watch for postoperative delirium  #5 CAD status post CABG-several years ago. Has been stable without any recent symptoms of angina, congestive heart failure or arrhythmia. No EKG changes.  #6 DVT prophylaxis per ortho . Started  lovenox Case discussed with Care Management/Social Worker. Management plans discussed with the patient, family and they are in agreement.  TOTAL TIME TAKING CARE OF THIS PATIENT: 30 minutes.  >50% time spent on counselling and coordination of care dter  POSSIBLE D/C IN 1 DAYS, DEPENDING ON CLINICAL CONDITION.  Note: This dictation was prepared with Dragon dictation along with smaller phrase technology. Any transcriptional errors that result from this process are unintentional.  Katena Petitjean M.D on 09/13/2015 at 5:14 PM  Between 7am to 6pm - Pager - [phone]  After 6pm go to [email or web address] - password [email or web address]  [facility]  Office  [phone]  CC: Primary care physician; Pcp Not In System

## 2015-09-13 NOTE — Progress Notes (Signed)
Anticoagulation monitoring(Lovenox):  82yo  F ordered Lovenox 30 mg Q24h  Filed Weights   09/12/15 1025  Weight: 134 lb (60.8 kg)   BMI 23   Lab Results  Component Value Date   CREATININE 0.71 09/13/2015   CREATININE 1.24 (H) 09/12/2015   CREATININE 1.02 (H) 09/11/2015   Estimated Creatinine Clearance: 42.8 mL/min (by C-G formula based on SCr of 0.8 mg/dL). Hemoglobin & Hematocrit     Component Value Date/Time   HGB 7.9 (L) 09/13/2015 0401   HCT 22.9 (L) 09/13/2015 0401   Renal fxn has improved:  Per Protocol for Patient with estCrcl > 30 ml/min and BMI < 40, will transition to Lovenox 40 mg Q24h      Bari MantisKristin Mir Fullilove PharmD Clinical Pharmacist 09/13/2015

## 2015-09-13 NOTE — Progress Notes (Signed)
Subjective:  POD #1 status post intramedullary fixation for left intertrochanteric hip fracture. Patient reports pain as minimal. She has no other complaints. Patient is more alert and talkative today. She received 1 unit of packed red blood cells for anemia earlier today.  Objective:   VITALS:   Vitals:   09/13/15 1320 09/13/15 1356 09/13/15 1431 09/13/15 1831  BP: (!) 90/46 (!) 104/52 (!) 108/38 (!) 109/42  Pulse: 78  83   Resp: 14  18   Temp: 98.5 F (36.9 C)  98.7 F (37.1 C)   TempSrc: Oral  Axillary   SpO2: 98%  99%   Weight:      Height:        PHYSICAL EXAM:  Left lower extremity: Patient's dressings are clean dry and intact. There is no active bleeding. There is no erythema or ecchymosis or significant swelling in the left thigh. The thigh and leg compartments are soft and compressible. She has intact sensation light touch, palpable pedal pulses and intact motor function throughout the left lower extremity.   LABS  Results for orders placed or performed during the hospital encounter of 09/11/15 (from the past 24 hour(s))  Glucose, capillary     Status: Abnormal   Collection Time: 09/12/15  9:06 PM  Result Value Ref Range   Glucose-Capillary 381 (H) 65 - 99 mg/dL   Comment 1 Repeat Test   Glucose, capillary     Status: Abnormal   Collection Time: 09/12/15  9:07 PM  Result Value Ref Range   Glucose-Capillary 420 (H) 65 - 99 mg/dL  CBC     Status: Abnormal   Collection Time: 09/13/15  4:01 AM  Result Value Ref Range   WBC 8.6 3.6 - 11.0 K/uL   RBC 2.62 (L) 3.80 - 5.20 MIL/uL   Hemoglobin 7.9 (L) 12.0 - 16.0 g/dL   HCT 29.522.9 (L) 62.135.0 - 30.847.0 %   MCV 87.3 80.0 - 100.0 fL   MCH 30.0 26.0 - 34.0 pg   MCHC 34.4 32.0 - 36.0 g/dL   RDW 65.714.3 84.611.5 - 96.214.5 %   Platelets 188 150 - 440 K/uL  Basic metabolic panel     Status: Abnormal   Collection Time: 09/13/15  4:01 AM  Result Value Ref Range   Sodium 138 135 - 145 mmol/L   Potassium 3.7 3.5 - 5.1 mmol/L   Chloride  106 101 - 111 mmol/L   CO2 26 22 - 32 mmol/L   Glucose, Bld 212 (H) 65 - 99 mg/dL   BUN 33 (H) 6 - 20 mg/dL   Creatinine, Ser 9.520.71 0.44 - 1.00 mg/dL   Calcium 8.0 (L) 8.9 - 10.3 mg/dL   GFR calc non Af Amer >60 >60 mL/min   GFR calc Af Amer >60 >60 mL/min   Anion gap 6 5 - 15  ABO/Rh     Status: None   Collection Time: 09/13/15  4:01 AM  Result Value Ref Range   ABO/RH(D) B POS   Glucose, capillary     Status: Abnormal   Collection Time: 09/13/15  7:34 AM  Result Value Ref Range   Glucose-Capillary 218 (H) 65 - 99 mg/dL   Comment 1 Notify RN   Prepare RBC     Status: None   Collection Time: 09/13/15  8:00 AM  Result Value Ref Range   Order Confirmation ORDER PROCESSED BY BLOOD BANK   Glucose, capillary     Status: Abnormal   Collection Time: 09/13/15 11:56 AM  Result Value Ref Range   Glucose-Capillary 218 (H) 65 - 99 mg/dL   Comment 1 Notify RN   Glucose, capillary     Status: None   Collection Time: 09/13/15  5:19 PM  Result Value Ref Range   Glucose-Capillary 93 65 - 99 mg/dL    Dg Pelvis Portable  Result Date: 09/12/2015 CLINICAL DATA:  Postop IM nail EXAM: PORTABLE PELVIS 1-2 VIEWS COMPARISON:  09/11/2015 FINDINGS: Interval placement of intra medullary nail and dynamic hip screw across the left femoral intertrochanteric fracture. Lesser trochanter remains displaced. Otherwise near anatomic alignment. No subluxation or dislocation. No visible hardware complicating feature. IMPRESSION: Internal fixation across the left intertrochanteric fracture. No complicating features visualized. Electronically Signed   By: Charlett NoseKevin  Dover M.D.   On: 09/12/2015 14:23   Dg C-arm 1-60 Min  Result Date: 09/12/2015 CLINICAL DATA:  Operative imaging during left proximal femur intertrochanteric fracture ORIF. EXAM: DG C-ARM 61-120 MIN; LEFT FEMUR 2 VIEWS COMPARISON:  None. FINDINGS: Images show placement along its measure rod to the left femur supporting a compression screw. The screw has reduces  the major fracture components into near anatomic alignment. There is no new fracture or evidence of an operative complication. IMPRESSION: Well aligned major fracture components following left intertrochanteric fracture ORIF. Electronically Signed   By: Amie Portlandavid  Ormond M.D.   On: 09/12/2015 12:53   Dg Femur Min 2 Views Left  Result Date: 09/12/2015 CLINICAL DATA:  Operative imaging during left proximal femur intertrochanteric fracture ORIF. EXAM: DG C-ARM 61-120 MIN; LEFT FEMUR 2 VIEWS COMPARISON:  None. FINDINGS: Images show placement along its measure rod to the left femur supporting a compression screw. The screw has reduces the major fracture components into near anatomic alignment. There is no new fracture or evidence of an operative complication. IMPRESSION: Well aligned major fracture components following left intertrochanteric fracture ORIF. Electronically Signed   By: Amie Portlandavid  Ormond M.D.   On: 09/12/2015 12:53   Dg Femur Port Min 2 Views Left  Result Date: 09/12/2015 CLINICAL DATA:  Postop IM nail EXAM: LEFT FEMUR PORTABLE 2 VIEWS COMPARISON:  None. FINDINGS: Placement of intra medullary nail and at and hip screw across the left intertrochanteric fracture. Two locking screws. No hardware complicating feature. Lesser trochanter remains displaced. IMPRESSION: Internal fixation across the left intertrochanteric fracture. No complicating feature. Electronically Signed   By: Charlett NoseKevin  Dover M.D.   On: 09/12/2015 14:24    Assessment/Plan: 1 Day Post-Op   Active Problems:   Hip fracture [facility](HCC)  Patient doing well from an orthopedic standpoint. Continue physical therapy. Recheck labs in the morning.  Patient Plavix and aspirin for DVT prophylaxis.    Juanell FairlyKRASINSKI, Rucker Pridgeon , MD 09/13/2015, 7:58 PM

## 2015-09-13 NOTE — Progress Notes (Signed)
Foley d/c'd at 0500 with 300cc urine output

## 2015-09-13 NOTE — Progress Notes (Signed)
Physical Therapy Treatment Patient Details Name: Kelli Valenzuela MRN: [id number] DOB: 03/08/1925 Today's Date: 09/13/2015    History of Present Illness Pt. is an 82 y.o. female who was [facility] to [facility] for an ORIF repair od a Left Hip Fracture. Pt. resides at a memory care unit.    PT Comments    Pt lethargic this pm.  Attempted BLE AAROM in attempts to awaken but she remained lethargic and not able to participate in session past AAROM in supine.  Follow Up Recommendations  SNF     Equipment Recommendations       Recommendations for Other Services       Precautions / Restrictions Precautions Precautions: Fall Restrictions Weight Bearing Restrictions: Yes LLE Weight Bearing: Weight bearing as tolerated    Mobility  Bed Mobility                  Transfers                    Ambulation/Gait                 Stairs            Wheelchair Mobility    Modified Rankin (Stroke Patients Only)       Balance                                    Cognition Arousal/Alertness: Lethargic Behavior During Therapy: WFL for tasks assessed/performed Overall Cognitive Status: History of cognitive impairments - at baseline                      Exercises Other Exercises Other Exercises: Supine A/AAROM for ankle pumps, hip/knee flexion, AB/ADDUCTION, x 10 BLE    General Comments        Pertinent Vitals/Pain Pain Assessment: No/denies pain Pain Descriptors / Indicators: Guarding    Home Living Family/patient expects to be discharged to:: Other (Comment) (Memory Care unit)                    Prior Function Level of Independence: Needs assistance          PT Goals (current goals can now be found in the care plan section)      Frequency  BID    PT Plan Current plan remains appropriate    Co-evaluation             End of Session   Activity Tolerance: Patient limited by lethargy Patient left: in  bed;with call bell/phone within reach;with bed alarm set     Time: 1400-1410 PT Time Calculation (min) (ACUTE ONLY): 10 min  Charges:  $Therapeutic Exercise: 8-22 mins                    G Codes:      Danielle DessSarah Jasie Meleski 09/13/2015, 2:26 PM

## 2015-09-13 NOTE — Progress Notes (Signed)
Inpatient Diabetes Program Recommendations  AACE/ADA: New Consensus Statement on Inpatient Glycemic Control (2015)  Target Ranges:  Prepandial:   less than 140 mg/dL      Peak postprandial:   less than 180 mg/dL (1-2 hours)      Critically ill patients:  140 - 180 mg/dL   Lab Results  Component Value Date   GLUCAP 218 (H) 09/13/2015   HGBA1C 7.5 (H) 09/11/2015    Review of Glycemic Control  Results for Mangano, Issis (MRN [id number]) as of 09/13/2015 10:09  Ref. Range 09/12/2015 12:50 09/12/2015 17:14 09/12/2015 21:06 09/12/2015 21:07 09/13/2015 07:34  Glucose-Capillary Latest Ref Range: 65 - 99 mg/dL 409173 (H) 811301 (H) 914381 (H) 420 (H) 218 (H)    Diabetes history: DM2 Outpatient Diabetes medications: Lantus 10 units QHS Current orders for Inpatient glycemic control: Lantus 15 units QHS, Novolog 0-15 units TID with meals, Novolog 0-5 units QHS  Inpatient Diabetes Program Recommendations:  Please consider ordering Novolog 3 units TID with meals for meal coverage and decreasing Novolog correction to 0-9 units tid.    This will ensure she will get a consistent amount of Novolog each meal and avoid high doses  Novolog one meal and no Novolog the next meal.  Susette RacerJulie Neysa Arts, RN, BA, [place]HA, CDE Diabetes Coordinator Inpatient Diabetes Program  [phone] (Team Pager) [phone] [facility] Office) 09/13/2015 10:12 AM

## 2015-09-13 NOTE — Evaluation (Signed)
Physical Therapy Evaluation Patient Details Name: Kelli Valenzuela MRN: [id number] DOB: 06/08/1925 Today's Date: 09/13/2015   History of Present Illness  presented to ER from memory care unit secondary to AMS, hyperglycemia, incidentally noted with L hip fracture; admitted status post ORIF (8/16) L LE, WBAT (confirmed via telephone with Dr. Martha ClanKrasinski)  Clinical Impression  Upon evaluation, patient oriented to self only; marked delay in processing and task initiation, typically requiring hand-over-hand assist for all functional activities.  Demonstrates fair post-op strength and ROM to L LE, though requires act assist for all movement due to cognitive deficits; good pain control with minimal clinical indicators of pain noted throughout session.  Currently requiring max assist +2 for bed mobility; mod/max assist +2 for sit/stand and standing balance. Very heavy posterior trunk lean with absent attempts at self-correction.  Unsafe/unable to attempt stepping/gait at this time; will continue to progress as able. Would benefit from skilled PT to address above deficits and promote optimal return to PLOF; recommend transition to STR upon discharge from acute hospitalization if able to consistently participate/progress.     Follow Up Recommendations SNF    Equipment Recommendations       Recommendations for Other Services       Precautions / Restrictions Precautions Precautions: Fall Restrictions Weight Bearing Restrictions: Yes LLE Weight Bearing: Weight bearing as tolerated      Mobility  Bed Mobility Overal bed mobility: Needs Assistance Bed Mobility: Supine to Sit;Sit to Supine     Supine to sit: Max assist;Total assist Sit to supine: Max assist;Total assist;+2 for physical assistance      Transfers Overall transfer level: Needs assistance Equipment used: Rolling walker (2 wheeled) Transfers: Sit to/from Stand Sit to Stand: Mod assist;Max assist;+2 physical assistance          General transfer comment: total assist for foot placement, forward weight shift, lift off and standing balance; very heavy posterior trunk lean/weight shift with absent attempts at spontaneous correction  Ambulation/Gait             General Gait Details: unsafe/unable  Stairs            Wheelchair Mobility    Modified Rankin (Stroke Patients Only)       Balance                                             Pertinent Vitals/Pain Pain Assessment: Faces Faces Pain Scale: Hurts a little bit Pain Location: L hip Pain Descriptors / Indicators: Guarding    Home Living Family/patient expects to be discharged to:: Other (Comment) (Memory care unit)                      Prior Function Level of Independence: Needs assistance         Comments: Patient unable to provide; will verify with family as available.  Per chart, ambulates with RW at baseline; able to feed and dress self.     Hand Dominance        Extremity/Trunk Assessment   Upper Extremity Assessment: Overall WFL for tasks assessed           Lower Extremity Assessment:  (bilat LEs grossly at least 3-/5; difficult to formally assess due to cognitive status)         Communication      Cognition Arousal/Alertness: Awake/alert Behavior During Therapy:  (very  delayed processing, poor/absent initiation) Overall Cognitive Status: Difficult to assess (oriented to self only; requires hand-over-hand to initiate all movement on command)                      General Comments      Exercises Other Exercises Other Exercises: Unsupported sitting edge of bed, attempted to promote forward reaching activities to increase forward weight shift; requiring hand-over-hand for task initiation. Poor carry-over Other Exercises: Sit/stand x3 with RW, mod/max assist +2 with step by step verbal instruction; poor midline orientation in A/P plane with heavy posterior lean.  Unsafe/unable  to progress towards attempts at stepping      Assessment/Plan    PT Assessment Patient needs continued PT services  PT Diagnosis Difficulty walking;Generalized weakness   PT Problem List Decreased strength;Decreased range of motion;Decreased activity tolerance;Decreased balance;Decreased cognition;Decreased safety awareness;Decreased knowledge of precautions;Decreased skin integrity;Pain  PT Treatment Interventions DME instruction;Gait training;Functional mobility training;Therapeutic activities;Therapeutic exercise;Balance training;Patient/family education   PT Goals (Current goals can be found in the Care Plan section) Acute Rehab PT Goals PT Goal Formulation: Patient unable to participate in goal setting Time For Goal Achievement: 09/27/15 Potential to Achieve Goals: Fair    Frequency BID (may consider transition to QD pending ability to actively participate/progress)   Barriers to discharge Decreased caregiver support      Co-evaluation               End of Session Equipment Utilized During Treatment: Gait belt Activity Tolerance: Patient tolerated treatment well Patient left: in bed;with call bell/phone within reach;with bed alarm set Nurse Communication: Mobility status         Time: [id number] PT Time Calculation (min) (ACUTE ONLY): 19 min   Charges:   PT Evaluation $PT Eval Moderate Complexity: 1 Procedure PT Treatments $Therapeutic Activity: 8-22 mins   PT G Codes:       Shanessa Hodak H. Manson PasseyBrown, PT, DPT, NCS 09/13/15, 9:59 AM [phone]

## 2015-09-13 NOTE — Evaluation (Signed)
Occupational Therapy Evaluation Patient Details Name: Velna Hatchetnna Heiser MRN: [id number] DOB: 08/27/1925 Today's Date: 09/13/2015    History of Present Illness Pt. is an 82 y.o. female who was [facility] for an ORIF repair od a Left Hip Fracture. Pt. resides at a memory care unit.   Clinical Impression   Pt. Is a 82 y.o. female who was admitted to [facility] for surgical intervention following a left Fracture s/p ORIF. Presents with impaired cognition at baseline, poor task initiation, pain, limited ROM, weakness, limited activity tolerance, and impaired functional mobility which hinder her ability to complete ADL tasks. Pt. Could benefit from skilled OT services for ADL training, pt. Family education about cognitive compensatory strategies, and functional mobility for ADLs in order to work towards returning to her PLOF. Pt. Could benefit from SNF level of care upon discharge.     Follow Up Recommendations  SNF    Equipment Recommendations       Recommendations for Other Services PT consult     Precautions / Restrictions Precautions Precautions: Fall Restrictions Weight Bearing Restrictions: Yes LLE Weight Bearing: Weight bearing as tolerated         Mobility deferred to PT.  Eval completed at bedside secondary to hemoglobin level, and cognitive status.                                            ADL Overall ADL's : Needs assistance/impaired                                       General ADL Comments: Pt. presents with poor task initiation, and requires maxA for ADL tasks. Questionable candidate for A/E training secondary to limited cognition, and poor task initiation.     Vision     Perception     Praxis      Pertinent Vitals/Pain Pain Assessment: No/denies pain Faces Pain Scale: Hurts a little bit Pain Location: L hip Pain Descriptors / Indicators: Guarding     Hand Dominance     Extremity/Trunk Assessment Upper Extremity  Assessment Upper Extremity Assessment: Overall WFL for tasks assessed     Communication Communication Communication: No difficulties   Cognition Arousal/Alertness: Lethargic Behavior During Therapy: WFL for tasks assessed/performed Overall Cognitive Status: History of cognitive impairments - at baseline                     General Comments       Exercises   Shoulder Instructions      Home Living Family/patient expects to be discharged to:: Other (Comment) (Memory Care unit)                                        Prior Functioning/Environment Level of Independence: Needs assistance        Comments: Patient unable to provide; will verify with family as available.  Per chart, ambulates with RW at baseline; able to feed and dress self.    OT Diagnosis: Generalized weakness;Cognitive deficits;Acute pain   OT Problem List: Decreased strength;Decreased range of motion;Decreased safety awareness;Decreased cognition;Pain;Decreased knowledge of use of DME or AE   OT Treatment/Interventions: Self-care/ADL training;Therapeutic exercise;Therapeutic activities;DME and/or AE instruction;Patient/family education;Cognitive remediation/compensation  OT Goals(Current goals can be found in the care plan section) Acute Rehab OT Goals OT Goal Formulation: Patient unable to participate in goal setting  OT Frequency: Min 2X/week   Barriers to D/C:            Co-evaluation              End of Session    Activity Tolerance: Patient limited by lethargy Patient left: in bed;with call bell/phone within reach;with bed alarm set   Time: 1150-1205 OT Time Calculation (min): 15 min Charges:  OT General Charges $OT Visit: 1 Procedure OT Evaluation $OT Eval High Complexity: 1 Procedure G-Codes:    Olegario MessierElaine Dainel Arcidiacono, MS, OTR/L 09/13/2015, 12:25 PM

## 2015-09-13 NOTE — Progress Notes (Signed)
Clinical Social Worker (CSW) contacted patient's daughter [name] and presented bed offers. Daughter chose [name]. [name] is aware of accepted bed offer. Plan is for [facility] to [name] Friday or Saturday pending medical clearance. CSW will continue to follow and assist as needed.   Baker Hughes IncorporatedBailey Mylo Choi, LCSW [phone]

## 2015-09-14 LAB — TYPE AND SCREEN
ABO/RH(D): B POS
ANTIBODY SCREEN: NEGATIVE
UNIT DIVISION: 0

## 2015-09-14 LAB — BASIC METABOLIC PANEL
ANION GAP: 4 — AB (ref 5–15)
BUN: 17 mg/dL (ref 6–20)
CALCIUM: 8 mg/dL — AB (ref 8.9–10.3)
CO2: 27 mmol/L (ref 22–32)
Chloride: 108 mmol/L (ref 101–111)
Creatinine, Ser: 0.54 mg/dL (ref 0.44–1.00)
Glucose, Bld: 78 mg/dL (ref 65–99)
POTASSIUM: 3.1 mmol/L — AB (ref 3.5–5.1)
Sodium: 139 mmol/L (ref 135–145)

## 2015-09-14 LAB — CBC
HEMATOCRIT: 25 % — AB (ref 35.0–47.0)
Hemoglobin: 8.6 g/dL — ABNORMAL LOW (ref 12.0–16.0)
MCH: 29.8 pg (ref 26.0–34.0)
MCHC: 34.5 g/dL (ref 32.0–36.0)
MCV: 86.3 fL (ref 80.0–100.0)
PLATELETS: 175 10*3/uL (ref 150–440)
RBC: 2.9 MIL/uL — AB (ref 3.80–5.20)
RDW: 14.4 % (ref 11.5–14.5)
WBC: 7.2 10*3/uL (ref 3.6–11.0)

## 2015-09-14 LAB — GLUCOSE, CAPILLARY
GLUCOSE-CAPILLARY: 189 mg/dL — AB (ref 65–99)
Glucose-Capillary: 77 mg/dL (ref 65–99)
Glucose-Capillary: 115 mg/dL — ABNORMAL HIGH (ref 65–99)

## 2015-09-14 MED ORDER — FLEET ENEMA 7-19 GM/118ML RE ENEM
1.0000 | ENEMA | Freq: Once | RECTAL | Status: AC
  Administered 2015-09-14: 1 via RECTAL

## 2015-09-14 MED ORDER — CLOPIDOGREL BISULFATE 75 MG PO TABS
75.0000 mg | ORAL_TABLET | Freq: Every day | ORAL | Status: DC
  Administered 2015-09-14: 75 mg via ORAL
  Filled 2015-09-14: qty 1

## 2015-09-14 MED ORDER — INSULIN GLARGINE 100 UNIT/ML ~~LOC~~ SOLN: 18.0000 [IU] | Freq: Every day | SUBCUTANEOUS | 11 refills | Status: DC

## 2015-09-14 MED ORDER — ENOXAPARIN SODIUM 40 MG/0.4ML ~~LOC~~ SOLN: 40.0000 mg | SUBCUTANEOUS | 0 refills | Status: DC

## 2015-09-14 MED ORDER — LISINOPRIL 5 MG PO TABS
2.5000 mg | ORAL_TABLET | Freq: Every day | ORAL | Status: DC
  Administered 2015-09-14: 2.5 mg via ORAL
  Filled 2015-09-14: qty 1

## 2015-09-14 MED ORDER — INSULIN ASPART 100 UNIT/ML ~~LOC~~ SOLN: 3.0000 [IU] | Freq: Three times a day (TID) | SUBCUTANEOUS | 11 refills | Status: DC

## 2015-09-14 MED ORDER — FERROUS SULFATE 325 (65 FE) MG PO TABS: 325.0000 mg | ORAL_TABLET | Freq: Three times a day (TID) | ORAL | 3 refills | Status: DC

## 2015-09-14 MED ORDER — LACTULOSE 10 GM/15ML PO SOLN: 30.0000 g | Freq: Once | ORAL | Status: DC

## 2015-09-14 MED ORDER — LACTULOSE 10 GM/15ML PO SOLN
30.0000 g | Freq: Once | ORAL | Status: DC
Start: 2015-09-14 — End: 2015-09-14
  Filled 2015-09-14: qty 60

## 2015-09-14 MED ORDER — TRAMADOL HCL 50 MG PO TABS: 50.0000 mg | ORAL_TABLET | Freq: Four times a day (QID) | ORAL | 0 refills | Status: DC | PRN

## 2015-09-14 NOTE — Clinical Social Work Placement (Signed)
   CLINICAL SOCIAL WORK PLACEMENT  NOTE  Date:  09/14/2015  Patient Details  Name: Kelli Valenzuela MRN: [id number] Date of Birth: 06/21/1925  Clinical Social Work is seeking post-discharge placement for this patient at the Skilled  Nursing Facility level of care (*CSW will initial, date and re-position this form in  chart as items are completed):  Yes   Patient/family provided with [facility] Clinical Social Work Department's list of facilities offering this level of care within the geographic area requested by the patient (or if unable, by the patient's family).  Yes   Patient/family informed of their freedom to choose among providers that offer the needed level of care, that participate in Medicare, Medicaid or managed care program needed by the patient, have an available bed and are willing to accept the patient.  Yes   Patient/family informed of [facility]'s ownership interest in [facility] and [facility], as well as of the fact that they are under no obligation to receive care at these facilities.  PASRR submitted to EDS on 09/12/15     PASRR number received on 09/12/15     Existing PASRR number confirmed on       FL2 transmitted to all facilities in geographic area requested by pt/family on 09/12/15     FL2 transmitted to all facilities within larger geographic area on       Patient informed that his/her managed care company has contracts with or will negotiate with certain facilities, including the following:        Yes   Patient/family informed of bed offers received.  Patient chooses bed at  [facility]     Physician recommends and patient chooses bed at      Patient to be transferred to  [facility] ) on 09/14/15.  Patient to be transferred to facility by  [facility] )     Patient family notified on 09/14/15 of transfer.  Name of family member notified:   (Patient's son Loraine LericheMark is aware of D/C today. CSW left patient's daughter [name] a  voicemail making her aware of above. )     PHYSICIAN       Additional Comment:    _______________________________________________ Zannie Locastro, Darleen CrockerBailey M, LCSW 09/14/2015, 10:42 AM

## 2015-09-14 NOTE — Care Management Important Message (Signed)
Important Message  Patient Details  Name: Kelli Valenzuela MRN: [id number] Date of Birth: 09/12/1925   Medicare Important Message Given:  Yes    Adonis HugueninBerkhead, Cuyler Vandyken L, RN 09/14/2015, 10:24 AM

## 2015-09-14 NOTE — Progress Notes (Signed)
Physical Therapy Treatment Patient Details Name: Kelli Valenzuela MRN: [id number] DOB: 10/26/1925 Today's Date: 09/14/2015    History of Present Illness presented to ER from memory care unit secondary to AMS, hyperglycemia, incidentally noted with L hip fracture; admitted status post ORIF (8/16) L LE, WBAT (confirmed via telephone with Dr. Martha ClanKrasinski)    PT Comments    Continues to require extensive physical assist (+2) for all functional activities.  Very heavy posterior weight shift in all sitting/standing postures, minimally amenable to facilitation for correction (patient resisting, pushing backwards harder). Unsafe to attempt stepping or OOB mobility as result.  Will continue to progress as able, emphasis on forward weight shift, midline orientation in A/P plane with sitting/standing activities.   Follow Up Recommendations  SNF     Equipment Recommendations       Recommendations for Other Services       Precautions / Restrictions Precautions Precautions: Fall Restrictions Weight Bearing Restrictions: Yes LLE Weight Bearing: Weight bearing as tolerated    Mobility  Bed Mobility Overal bed mobility: Needs Assistance Bed Mobility: Rolling;Supine to Sit;Sit to Supine Rolling: Mod assist;Max assist   Supine to sit: Max assist Sit to supine: Max assist;Total assist;+2 for safety/equipment      Transfers Overall transfer level: Needs assistance Equipment used: Rolling walker (2 wheeled) Transfers: Sit to/from Stand Sit to Stand: Max assist;+2 physical assistance         General transfer comment: very heavy posterior trunk lean, maintains hips/pelvis posterior to BOS with sustained bilat ankle PF.  Patient very resistant to attempts at correction by therapist  Ambulation/Gait             General Gait Details: unsafe/unable   Stairs            Wheelchair Mobility    Modified Rankin (Stroke Patients Only)       Balance Overall balance assessment:  Needs assistance Sitting-balance support: No upper extremity supported;Feet supported Sitting balance-Leahy Scale: Fair     Standing balance support: Bilateral upper extremity supported Standing balance-Leahy Scale: Zero                      Cognition Arousal/Alertness: Awake/alert Behavior During Therapy: WFL for tasks assessed/performed Overall Cognitive Status: History of cognitive impairments - at baseline                      Exercises Other Exercises Other Exercises: Rolling bilat for hygiene, mod/max assist with hand-over-hand for UE placement on bedrails to assist; dep for hygiene and clothing/linen change. Other Exercises: Supine LE therex, 1x10, act assist ROM: ankle pumps, heel slides, hip abduct/adduct Other Exercises: Unsupported sitting edge of bed, participated with dynamic reaching activity (R > L UE secondary to shoulder ROM limitations) to promote forward trunk lean/weight shift.  good performance with functional activity; poor ability to generalize into functional activity. Other Exercises: Sit/stand with RW x3, max assist +2 for lift off, weight shift and standing balance.  Very resistant to attemtped facilitation of postural extension, anterior weight translation    General Comments        Pertinent Vitals/Pain Pain Assessment: Faces Faces Pain Scale: Hurts little more Pain Location: L hip Pain Descriptors / Indicators: Aching;Grimacing;Guarding Pain Intervention(s): Limited activity within patient's tolerance;Monitored during session;Repositioned    Home Living                      Prior Function  PT Goals (current goals can now be found in the care plan section) Acute Rehab PT Goals PT Goal Formulation: Patient unable to participate in goal setting Time For Goal Achievement: 09/27/15 Potential to Achieve Goals: Fair Progress towards PT goals: Progressing toward goals    Frequency  7X/week    PT Plan Frequency  needs to be updated    Co-evaluation             End of Session Equipment Utilized During Treatment: Gait belt Activity Tolerance: Patient tolerated treatment well Patient left: in bed;with call bell/phone within reach;with bed alarm set;with family/visitor present     Time: [id number] PT Time Calculation (min) (ACUTE ONLY): 28 min  Charges:  $Therapeutic Activity: 23-37 mins                    G Codes:      Mirabelle Cyphers H. Manson PasseyBrown, PT, DPT, NCS 09/14/15, 12:08 PM [phone]

## 2015-09-14 NOTE — Progress Notes (Signed)
Called report to Mirian CapuchinDonna Watts @ [facility]. Patient going to Rm 326. EMS called for transport. Belongings packed and given upon discharge.

## 2015-09-14 NOTE — Discharge Summary (Signed)
[facility] - [place] at [facility]   PATIENT NAME: Kelli Valenzuela    MR#:  [id number]  DATE OF BIRTH:  07-Apr-1925  DATE OF ADMISSION:  09/11/2015 ADMITTING PHYSICIAN: Enid Baas, MD  DATE OF DISCHARGE: 09/14/15  PRIMARY CARE PHYSICIAN: Pcp Not In System    ADMISSION DIAGNOSIS:  Fall [W19.XXXA] Closed left hip fracture, initial encounter (HCC) [S72.002A]  DISCHARGE DIAGNOSIS:  LEft hip fracture s/p surgery  SECONDARY DIAGNOSIS:   Past Medical History:  Diagnosis Date  . Coronary artery disease    s/p MI  . Dementia   . Depression   . Diabetes mellitus without complication (HCC)    secondary to gallstone pancreatitis and subsequent pancreatitc destruction  . Hyperlipidemia     HOSPITAL COURSE:  Kelli Valenzuela a 82 y.o. femalewith a known history of Severe dementia, CAD status post CABG in last stenting was redder than 10 years ago, diabetes mellitus with recent admission to the hospital last week for hypoglycemia presents to the hospital for elevated sugars today and incidentally noted to have left hip fracture.  #1 left hip fracture-communicative intertrochanteric fracture noted on the left. No fall identified. -Orthoconsulted. Appreciate Dr Burna Sis input. -POD#2 -Pain control -Physical therapy recommends SNF -got 1 unit BT for post-op anemia---Hgb today 8.6  #2 diabetes mellitus with hyperglycemia -A1c +7.8. Last week patient was hypoglycemic with changes in her Lantus. -Restart Lantus at lower dose, sliding scale insulin and monitor sugars closely.  #3 hypertension -resume lisinopril and coreg  #4 dementia with depression - Patient seems to be at baseline. - No agitation or behavioral disturbance. Does have hallucinations at times  #5 CAD status post CABG-several years ago. Has been stable without any recent symptoms of angina, congestive heart failure or arrhythmia. No EKG changes. -on plavix  #6 DVT prophylaxis  per ortho lovenox for 3 weeks  D/c to rehab today Left message for dter Chales Abrahams  CONSULTS OBTAINED:  Treatment Team:  Juanell Fairly, MD  DRUG ALLERGIES:   Allergies  Allergen Reactions  . Sulfa Antibiotics Hives    DISCHARGE MEDICATIONS:   Current Discharge Medication List    START taking these medications   Details  enoxaparin (LOVENOX) 40 MG/0.4ML injection Inject 0.4 mLs (40 mg total) into the skin daily. Qty: 21 Syringe, Refills: 0    ferrous sulfate 325 (65 FE) MG tablet Take 1 tablet (325 mg total) by mouth 3 (three) times daily after meals. Qty: 90 tablet, Refills: 3    insulin aspart (NOVOLOG) 100 UNIT/ML injection Inject 3 Units into the skin 3 (three) times daily with meals. Qty: 10 mL, Refills: 11    traMADol (ULTRAM) 50 MG tablet Take 1 tablet (50 mg total) by mouth every 6 (six) hours as needed for moderate pain. Qty: 30 tablet, Refills: 0      CONTINUE these medications which have CHANGED   Details  insulin glargine (LANTUS) 100 UNIT/ML injection Inject 0.18 mLs (18 Units total) into the skin at bedtime. Qty: 10 mL, Refills: 11      CONTINUE these medications which have NOT CHANGED   Details  buPROPion (WELLBUTRIN) 100 MG tablet Take 100 mg by mouth every other day.    carvedilol (COREG) 6.25 MG tablet Take 6.25 mg by mouth 2 (two) times daily with a meal.    clopidogrel (PLAVIX) 75 MG tablet Take 75 mg by mouth daily.    lisinopril (PRINIVIL,ZESTRIL) 2.5 MG tablet Take 2.5 mg by mouth daily.    meloxicam (MOBIC)  7.5 MG tablet Take 7.5 mg by mouth daily.    vitamin B-12 (CYANOCOBALAMIN) 1000 MCG tablet Take 1,000 mcg by mouth daily.        If you experience worsening of your admission symptoms, develop shortness of breath, life threatening emergency, suicidal or homicidal thoughts you must seek medical attention immediately by calling 911 or calling your MD immediately  if symptoms less severe.  You Must read complete  instructions/literature along with all the possible adverse reactions/side effects for all the Medicines you take and that have been prescribed to you. Take any new Medicines after you have completely understood and accept all the possible adverse reactions/side effects.   Please note  You were cared for by a hospitalist during your hospital stay. If you have any questions about your discharge medications or the care you received while you were in the hospital after you are discharged, you can call the unit and asked to speak with the hospitalist on call if the hospitalist that took care of you is not available. Once you are discharged, your primary care physician will handle any further medical issues. Please note that NO REFILLS for any discharge medications will be authorized once you are discharged, as it is imperative that you return to your primary care physician (or establish a relationship with a primary care physician if you do not have one) for your aftercare needs so that they can reassess your need for medications and monitor your lab values. Today   SUBJECTIVE   No new complaints. Baseline confusion  VITAL SIGNS:  Blood pressure (!) 140/54, pulse 79, temperature 97.4 F (36.3 C), temperature source Oral, resp. rate 18, height 5\' 4"  (1.626 m), weight 134 lb (60.8 kg), SpO2 94 %.  I/O:   Intake/Output Summary (Last 24 hours) at 09/14/15 0824 Last data filed at 09/14/15 0400  Gross per 24 hour  Intake          2736.25 ml  Output                1 ml  Net          2735.25 ml    PHYSICAL EXAMINATION:  GENERAL:  82 y.o.-year-old patient lying in the bed with no acute distress.  EYES: Pupils equal, round, reactive to light and accommodation. No scleral icterus. Extraocular muscles intact.  HEENT: Head atraumatic, normocephalic. Oropharynx and nasopharynx clear.  NECK:  Supple, no jugular venous distention. No thyroid enlargement, no tenderness.  LUNGS: Normal breath sounds  bilaterally, no wheezing, rales,rhonchi or crepitation. No use of accessory muscles of respiration.  CARDIOVASCULAR: S1, S2 normal. No murmurs, rubs, or gallops.  ABDOMEN: Soft, non-tender, non-distended. Bowel sounds present. No organomegaly or mass.  EXTREMITIES: No pedal edema, cyanosis, or clubbing.  NEUROLOGIC: grossly intact PSYCHIATRIC: The patient is alert SKIN: No obvious rash, lesion, or ulcer.   DATA REVIEW:   CBC   Recent Labs Lab 09/14/15 0521  WBC 7.2  HGB 8.6*  HCT 25.0*  PLT 175    Chemistries   Recent Labs Lab 09/11/15 2227  09/14/15 0521  NA 135  < > 139  K 4.2  < > 3.1*  CL 101  < > 108  CO2 21*  < > 27  GLUCOSE 316*  < > 78  BUN 42*  < > 17  CREATININE 1.02*  < > 0.54  CALCIUM 9.0  < > 8.0*  AST 15  --   --   ALT 12*  --   --  ALKPHOS 110  --   --   BILITOT 0.8  --   --   < > = values in this interval not displayed.  Microbiology Results   Recent Results (from the past 240 hour(s))  MRSA PCR Screening     Status: None   Collection Time: 09/08/15  3:26 AM  Result Value Ref Range Status   MRSA by PCR NEGATIVE NEGATIVE Final    Comment:        The GeneXpert MRSA Assay (FDA approved for NASAL specimens only), is one component of a comprehensive MRSA colonization surveillance program. It is not intended to diagnose MRSA infection nor to guide or monitor treatment for MRSA infections.   Urine culture     Status: None   Collection Time: 09/11/15 11:42 AM  Result Value Ref Range Status   Specimen Description URINE, RANDOM  Final   Special Requests NONE  Final   Culture [facility] GROWTH [facility]    Final   Report Status 09/13/2015 FINAL  Final    RADIOLOGY:  Dg Pelvis Portable  Result Date: 09/12/2015 CLINICAL DATA:  Postop IM nail EXAM: PORTABLE PELVIS 1-2 VIEWS COMPARISON:  09/11/2015 FINDINGS: Interval placement of intra medullary nail and dynamic hip screw across the left femoral intertrochanteric fracture.  Lesser trochanter remains displaced. Otherwise near anatomic alignment. No subluxation or dislocation. No visible hardware complicating feature. IMPRESSION: Internal fixation across the left intertrochanteric fracture. No complicating features visualized. Electronically Signed   By: Charlett NoseKevin  Dover M.D.   On: 09/12/2015 14:23   Dg C-arm 1-60 Min  Result Date: 09/12/2015 CLINICAL DATA:  Operative imaging during left proximal femur intertrochanteric fracture ORIF. EXAM: DG C-ARM 61-120 MIN; LEFT FEMUR 2 VIEWS COMPARISON:  None. FINDINGS: Images show placement along its measure rod to the left femur supporting a compression screw. The screw has reduces the major fracture components into near anatomic alignment. There is no new fracture or evidence of an operative complication. IMPRESSION: Well aligned major fracture components following left intertrochanteric fracture ORIF. Electronically Signed   By: Amie Portlandavid  Ormond M.D.   On: 09/12/2015 12:53   Dg Femur Min 2 Views Left  Result Date: 09/12/2015 CLINICAL DATA:  Operative imaging during left proximal femur intertrochanteric fracture ORIF. EXAM: DG C-ARM 61-120 MIN; LEFT FEMUR 2 VIEWS COMPARISON:  None. FINDINGS: Images show placement along its measure rod to the left femur supporting a compression screw. The screw has reduces the major fracture components into near anatomic alignment. There is no new fracture or evidence of an operative complication. IMPRESSION: Well aligned major fracture components following left intertrochanteric fracture ORIF. Electronically Signed   By: Amie Portlandavid  Ormond M.D.   On: 09/12/2015 12:53   Dg Femur Port Min 2 Views Left  Result Date: 09/12/2015 CLINICAL DATA:  Postop IM nail EXAM: LEFT FEMUR PORTABLE 2 VIEWS COMPARISON:  None. FINDINGS: Placement of intra medullary nail and at and hip screw across the left intertrochanteric fracture. Two locking screws. No hardware complicating feature. Lesser trochanter remains displaced.  IMPRESSION: Internal fixation across the left intertrochanteric fracture. No complicating feature. Electronically Signed   By: Charlett NoseKevin  Dover M.D.   On: 09/12/2015 14:24     Management plans discussed with the patient, family and they are in agreement.  CODE STATUS:  Code Status History    Date Active Date Inactive Code Status Order ID Comments User Context   09/11/2015  5:11 PM 09/12/2015  2:10 PM DNR [id number]  Juanell FairlyKevin Krasinski, MD Inpatient  09/11/2015  5:10 PM 09/11/2015  5:11 PM DNR [id number]  Enid Baas, MD Inpatient   09/08/2015  3:05 AM 09/09/2015  5:11 PM DNR [id number]  Arnaldo Natal, MD Inpatient   09/08/2015  2:33 AM 09/08/2015  3:05 AM Full Code [id number]  Arnaldo Natal, MD Inpatient    Questions for Most Recent Historical Code Status (Order [id number])    Question Answer Comment   In the event of cardiac or respiratory ARREST Do not call a "code blue"    In the event of cardiac or respiratory ARREST Do not perform Intubation, CPR, defibrillation or ACLS    In the event of cardiac or respiratory ARREST Use medication by any route, position, wound care, and other measures to relive pain and suffering. May use oxygen, suction and manual treatment of airway obstruction as needed for comfort.         Advance Directive Documentation   Flowsheet Row Most Recent Value  Type of Advance Directive  Out of facility DNR (pink MOST or yellow form)  Pre-existing out of facility DNR order (yellow form or pink MOST form)  Yellow form placed in chart (order not valid for inpatient use)  "MOST" Form in Place?  No data      TOTAL TIME TAKING CARE OF THIS PATIENT: 40 minutes.    Tammy Wickliffe M.D on 09/14/2015 at 8:24 AM  Between 7am to 6pm - Pager - [phone] After 6pm go to [email or web address] - password [email or web address]  [facility]  Office  [phone]  CC: Primary care physician; Pcp Not In System

## 2015-09-14 NOTE — Progress Notes (Addendum)
Inpatient Diabetes Program Recommendations  AACE/ADA: New Consensus Statement on Inpatient Glycemic Control (2015)  Target Ranges:  Prepandial:   less than 140 mg/dL      Peak postprandial:   less than 180 mg/dL (1-2 hours)      Critically ill patients:  140 - 180 mg/dL   Lab Results  Component Value Date   GLUCAP 77 09/14/2015   HGBA1C 7.5 (H) 09/11/2015    Review of Glycemic ControlResults for Kelli Valenzuela, [name] (MRN [id number]) as of 09/14/2015 09:32  Ref. Range 09/13/2015 07:34 09/13/2015 11:56 09/13/2015 17:19 09/13/2015 21:06 09/14/2015 08:19  Glucose-Capillary Latest Ref Range: 65 - 99 mg/dL 782218 (H) 956218 (H) 93 91 77   Inpatient Diabetes Program Recommendations:  Consider reducing Lantus to 14 units daily.   Thanks, Beryl MeagerJenny Clint Strupp, RN, BC-ADM Inpatient Diabetes Coordinator Pager [phone] (8a-5p)

## 2015-09-14 NOTE — Progress Notes (Signed)
Patient is medically stable for D/C to [facility]. Per Sue LushAndrea [facility] to [facility]. RN will call report at [phone] and arrange EMS for transport. Clinical Child psychotherapistocial Worker (CSW) sent [facility]C orders to [facility] via [employer]. CSW contacted patient's son Loraine LericheMark and made him aware of above. CSW contacted patient's daughter [name] and left her a voicemail making her aware of above. Please reconsult if future social work needs arise. CSW signing off.   Baker Hughes IncorporatedBailey Khalon Cansler, LCSW [phone]

## 2015-09-14 NOTE — Progress Notes (Signed)
Subjective:  POD #2 s/p IM fixation for left intertrochanteric hip fracture.  Patient reports pain as mild.  She is sitting up in bed eating lunch and has no complaints.  Objective:   VITALS:   Vitals:   09/13/15 1831 09/13/15 2042 09/14/15 0354 09/14/15 0758  BP: (!) 109/42 (!) 139/54 (!) 144/52 (!) 140/54  Pulse:  82 77 79  Resp:  16 16 18   Temp:  98.7 F (37.1 C) 98.2 F (36.8 C) 97.4 F (36.3 C)  TempSrc:  Oral Oral Oral  SpO2:  99% 96% 94%  Weight:      Height:        PHYSICAL EXAM:  Left lower extremity: Patient's dressings remain clean dry and intact. Her left side shows no significant erythema or ecchymosis or swelling. Her compartments of the thigh and leg are soft and compressible. She has intact sensation light touch in palpable pedal pulses. She has intact motor function distally in the left lower extremity.   LABS  Results for orders placed or performed during the hospital encounter of 09/11/15 (from the past 24 hour(s))  Glucose, capillary     Status: None   Collection Time: 09/13/15  5:19 PM  Result Value Ref Range   Glucose-Capillary 93 65 - 99 mg/dL  Glucose, capillary     Status: None   Collection Time: 09/13/15  9:06 PM  Result Value Ref Range   Glucose-Capillary 91 65 - 99 mg/dL   Comment 1 Notify RN   CBC     Status: Abnormal   Collection Time: 09/14/15  5:21 AM  Result Value Ref Range   WBC 7.2 3.6 - 11.0 K/uL   RBC 2.90 (L) 3.80 - 5.20 MIL/uL   Hemoglobin 8.6 (L) 12.0 - 16.0 g/dL   HCT 16.125.0 (L) 09.635.0 - 04.547.0 %   MCV 86.3 80.0 - 100.0 fL   MCH 29.8 26.0 - 34.0 pg   MCHC 34.5 32.0 - 36.0 g/dL   RDW 40.914.4 81.111.5 - 91.414.5 %   Platelets 175 150 - 440 K/uL  Basic metabolic panel     Status: Abnormal   Collection Time: 09/14/15  5:21 AM  Result Value Ref Range   Sodium 139 135 - 145 mmol/L   Potassium 3.1 (L) 3.5 - 5.1 mmol/L   Chloride 108 101 - 111 mmol/L   CO2 27 22 - 32 mmol/L   Glucose, Bld 78 65 - 99 mg/dL   BUN 17 6 - 20 mg/dL   Creatinine, Ser 7.820.54 0.44 - 1.00 mg/dL   Calcium 8.0 (L) 8.9 - 10.3 mg/dL   GFR calc non Af Amer >60 >60 mL/min   GFR calc Af Amer >60 >60 mL/min   Anion gap 4 (L) 5 - 15  Glucose, capillary     Status: None   Collection Time: 09/14/15  8:19 AM  Result Value Ref Range   Glucose-Capillary 77 65 - 99 mg/dL  Glucose, capillary     Status: Abnormal   Collection Time: 09/14/15 11:34 AM  Result Value Ref Range   Glucose-Capillary 115 (H) 65 - 99 mg/dL    Dg Pelvis Portable  Result Date: 09/12/2015 CLINICAL DATA:  Postop IM nail EXAM: PORTABLE PELVIS 1-2 VIEWS COMPARISON:  09/11/2015 FINDINGS: Interval placement of intra medullary nail and dynamic hip screw across the left femoral intertrochanteric fracture. Lesser trochanter remains displaced. Otherwise near anatomic alignment. No subluxation or dislocation. No visible hardware complicating feature. IMPRESSION: Internal fixation across the left intertrochanteric fracture. No complicating  features visualized. Electronically Signed   By: Charlett NoseKevin  Dover M.D.   On: 09/12/2015 14:23   Dg Femur Port Min 2 Views Left  Result Date: 09/12/2015 CLINICAL DATA:  Postop IM nail EXAM: LEFT FEMUR PORTABLE 2 VIEWS COMPARISON:  None. FINDINGS: Placement of intra medullary nail and at and hip screw across the left intertrochanteric fracture. Two locking screws. No hardware complicating feature. Lesser trochanter remains displaced. IMPRESSION: Internal fixation across the left intertrochanteric fracture. No complicating feature. Electronically Signed   By: Charlett NoseKevin  Dover M.D.   On: 09/12/2015 14:24    Assessment/Plan: 2 Days Post-Op   Active Problems:   Hip fracture (HCC)  Patient's hemoglobin and hematocrit improved after a unit of PRBCs yesterday. Patient is doing well from orthopedic standpoint. She has been cleared by medicine for discharge to rehabilitation. Patient will follow up with me in the office in 2 weeks. She will remain weightbearing as tolerated in  the left lower extremity.  Continue Plavix and aspirin for DVT prophylaxis.    Juanell FairlyKRASINSKI, Xzander Gilham , MD 09/14/2015, 12:50 PM

## 2015-09-14 NOTE — Progress Notes (Signed)
Patient VSS at this time, EMS arrived for transport

## 2015-09-17 DIAGNOSIS — G301 Alzheimer's disease with late onset: Secondary | ICD-10-CM | POA: Diagnosis not present

## 2015-09-17 DIAGNOSIS — E119 Type 2 diabetes mellitus without complications: Secondary | ICD-10-CM | POA: Diagnosis not present

## 2015-09-17 DIAGNOSIS — I739 Peripheral vascular disease, unspecified: Secondary | ICD-10-CM | POA: Diagnosis not present

## 2015-09-17 DIAGNOSIS — F39 Unspecified mood [affective] disorder: Secondary | ICD-10-CM

## 2015-09-17 DIAGNOSIS — S7292XA Unspecified fracture of left femur, initial encounter for closed fracture: Secondary | ICD-10-CM | POA: Diagnosis not present

## 2015-09-27 DIAGNOSIS — S90822A Blister (nonthermal), left foot, initial encounter: Secondary | ICD-10-CM | POA: Diagnosis not present

## 2015-10-22 ENCOUNTER — Observation Stay
Admission: EM | Admit: 2015-10-22 | Discharge: 2015-10-23 | Disposition: A | Discharge status: Skilled Nursing Facility | Payer: Medicare Other | Attending: Internal Medicine | Admitting: Internal Medicine

## 2015-10-22 ENCOUNTER — Emergency Department: Payer: Medicare Other

## 2015-10-22 ENCOUNTER — Encounter: Payer: Self-pay | Admitting: Emergency Medicine

## 2015-10-22 DIAGNOSIS — Z951 Presence of aortocoronary bypass graft: Secondary | ICD-10-CM | POA: Insufficient documentation

## 2015-10-22 DIAGNOSIS — I700 Atherosclerosis of aorta: Secondary | ICD-10-CM | POA: Insufficient documentation

## 2015-10-22 DIAGNOSIS — E162 Hypoglycemia, unspecified: Secondary | ICD-10-CM | POA: Diagnosis present

## 2015-10-22 DIAGNOSIS — I2510 Atherosclerotic heart disease of native coronary artery without angina pectoris: Secondary | ICD-10-CM | POA: Insufficient documentation

## 2015-10-22 DIAGNOSIS — I10 Essential (primary) hypertension: Secondary | ICD-10-CM | POA: Insufficient documentation

## 2015-10-22 DIAGNOSIS — Z882 Allergy status to sulfonamides status: Secondary | ICD-10-CM | POA: Diagnosis not present

## 2015-10-22 DIAGNOSIS — E860 Dehydration: Secondary | ICD-10-CM | POA: Diagnosis not present

## 2015-10-22 DIAGNOSIS — F329 Major depressive disorder, single episode, unspecified: Secondary | ICD-10-CM | POA: Diagnosis not present

## 2015-10-22 DIAGNOSIS — I252 Old myocardial infarction: Secondary | ICD-10-CM | POA: Diagnosis not present

## 2015-10-22 DIAGNOSIS — E785 Hyperlipidemia, unspecified: Secondary | ICD-10-CM | POA: Diagnosis not present

## 2015-10-22 DIAGNOSIS — Z794 Long term (current) use of insulin: Secondary | ICD-10-CM | POA: Insufficient documentation

## 2015-10-22 DIAGNOSIS — Z96649 Presence of unspecified artificial hip joint: Secondary | ICD-10-CM | POA: Diagnosis not present

## 2015-10-22 DIAGNOSIS — E11649 Type 2 diabetes mellitus with hypoglycemia without coma: Principal | ICD-10-CM | POA: Insufficient documentation

## 2015-10-22 DIAGNOSIS — Z66 Do not resuscitate: Secondary | ICD-10-CM | POA: Insufficient documentation

## 2015-10-22 DIAGNOSIS — F0390 Unspecified dementia without behavioral disturbance: Secondary | ICD-10-CM | POA: Diagnosis not present

## 2015-10-22 DIAGNOSIS — M1990 Unspecified osteoarthritis, unspecified site: Secondary | ICD-10-CM | POA: Insufficient documentation

## 2015-10-22 DIAGNOSIS — L8990 Pressure ulcer of unspecified site, unspecified stage: Secondary | ICD-10-CM | POA: Insufficient documentation

## 2015-10-22 LAB — COMPREHENSIVE METABOLIC PANEL
ALK PHOS: 98 U/L (ref 38–126)
ALT: 9 U/L — ABNORMAL LOW (ref 14–54)
ANION GAP: 8 (ref 5–15)
AST: 16 U/L (ref 15–41)
Albumin: 3 g/dL — ABNORMAL LOW (ref 3.5–5.0)
BUN: 32 mg/dL — ABNORMAL HIGH (ref 6–20)
CALCIUM: 8.7 mg/dL — AB (ref 8.9–10.3)
CO2: 26 mmol/L (ref 22–32)
Chloride: 101 mmol/L (ref 101–111)
Creatinine, Ser: 1.02 mg/dL — ABNORMAL HIGH (ref 0.44–1.00)
GFR calc non Af Amer: 48 mL/min — ABNORMAL LOW (ref >60)
GFR, EST AFRICAN AMERICAN: 56 mL/min — AB (ref >60)
Glucose, Bld: 145 mg/dL — ABNORMAL HIGH (ref 65–99)
POTASSIUM: 3.8 mmol/L (ref 3.5–5.1)
SODIUM: 135 mmol/L (ref 135–145)
TOTAL PROTEIN: 6.5 g/dL (ref 6.5–8.1)
Total Bilirubin: 0.5 mg/dL (ref 0.3–1.2)

## 2015-10-22 LAB — CBC WITH DIFFERENTIAL/PLATELET
BASOS PCT: 0 %
Basophils Absolute: 0 10*3/uL (ref 0–0.1)
EOS ABS: 0 10*3/uL (ref 0–0.7)
EOS PCT: 0 %
HCT: 31.1 % — ABNORMAL LOW (ref 35.0–47.0)
HEMOGLOBIN: 10.6 g/dL — AB (ref 12.0–16.0)
LYMPHS ABS: 1.3 10*3/uL (ref 1.0–3.6)
Lymphocytes Relative: 12 %
MCH: 30.1 pg (ref 26.0–34.0)
MCHC: 34.2 g/dL (ref 32.0–36.0)
MCV: 88.2 fL (ref 80.0–100.0)
MONO ABS: 0.9 10*3/uL (ref 0.2–0.9)
MONOS PCT: 9 %
Neutro Abs: 8.1 10*3/uL — ABNORMAL HIGH (ref 1.4–6.5)
Neutrophils Relative %: 79 %
Platelets: 337 10*3/uL (ref 150–440)
RBC: 3.53 MIL/uL — ABNORMAL LOW (ref 3.80–5.20)
RDW: 16.6 % — AB (ref 11.5–14.5)
WBC: 10.3 10*3/uL (ref 3.6–11.0)

## 2015-10-22 LAB — URINALYSIS COMPLETE WITH MICROSCOPIC (ARMC ONLY)
Bilirubin Urine: NEGATIVE
GLUCOSE, UA: NEGATIVE mg/dL
NITRITE: NEGATIVE
Protein, ur: 30 mg/dL — AB
Specific Gravity, Urine: 1.017 (ref 1.005–1.030)
pH: 5 (ref 5.0–8.0)

## 2015-10-22 LAB — GLUCOSE, CAPILLARY
GLUCOSE-CAPILLARY: 144 mg/dL — AB (ref 65–99)
GLUCOSE-CAPILLARY: 97 mg/dL (ref 65–99)
GLUCOSE-CAPILLARY: 69 mg/dL (ref 65–99)
GLUCOSE-CAPILLARY: 117 mg/dL — AB (ref 65–99)
Glucose-Capillary: 138 mg/dL — ABNORMAL HIGH (ref 65–99)
Glucose-Capillary: 75 mg/dL (ref 65–99)
Glucose-Capillary: 98 mg/dL (ref 65–99)

## 2015-10-22 MED ORDER — FERROUS SULFATE 325 (65 FE) MG PO TABS
325.0000 mg | ORAL_TABLET | Freq: Three times a day (TID) | ORAL | Status: DC
Start: 2015-10-23 — End: 2015-10-23
  Administered 2015-10-23 (×2): 325 mg via ORAL
  Filled 2015-10-22 (×2): qty 1

## 2015-10-22 MED ORDER — VITAMIN D (ERGOCALCIFEROL) 1.25 MG (50000 UNIT) PO CAPS: 50000.0000 [IU] | ORAL_CAPSULE | ORAL | Status: DC

## 2015-10-22 MED ORDER — ONDANSETRON HCL 4 MG PO TABS: 4.0000 mg | ORAL_TABLET | Freq: Four times a day (QID) | ORAL | Status: DC | PRN

## 2015-10-22 MED ORDER — DEXTROSE-NACL 5-0.45 % IV SOLN
INTRAVENOUS | Status: DC
  Administered 2015-10-22 (×2): via INTRAVENOUS

## 2015-10-22 MED ORDER — DEXTROSE-NACL 5-0.45 % IV SOLN
INTRAVENOUS | Status: DC
  Administered 2015-10-22: 19:00:00 via INTRAVENOUS

## 2015-10-22 MED ORDER — ENOXAPARIN SODIUM 40 MG/0.4ML ~~LOC~~ SOLN
40.0000 mg | SUBCUTANEOUS | Status: DC
  Administered 2015-10-22: 23:00:00 40 mg via SUBCUTANEOUS
  Filled 2015-10-22: qty 0.4

## 2015-10-22 MED ORDER — CLOPIDOGREL BISULFATE 75 MG PO TABS
75.0000 mg | ORAL_TABLET | Freq: Every day | ORAL | Status: DC
  Administered 2015-10-23: 10:00:00 75 mg via ORAL
  Filled 2015-10-22: qty 1

## 2015-10-22 MED ORDER — DOCUSATE SODIUM 100 MG PO CAPS
100.0000 mg | ORAL_CAPSULE | Freq: Two times a day (BID) | ORAL | Status: DC
  Administered 2015-10-22 – 2015-10-23 (×2): 100 mg via ORAL
  Filled 2015-10-22 (×2): qty 1

## 2015-10-22 MED ORDER — ONDANSETRON HCL 4 MG/2ML IJ SOLN: 4.0000 mg | Freq: Four times a day (QID) | INTRAMUSCULAR | Status: DC | PRN

## 2015-10-22 MED ORDER — MELOXICAM 7.5 MG PO TABS
7.5000 mg | ORAL_TABLET | Freq: Every day | ORAL | Status: DC
  Administered 2015-10-23: 7.5 mg via ORAL
  Filled 2015-10-22: qty 1

## 2015-10-22 MED ORDER — VITAMIN B-12 1000 MCG PO TABS
1000.0000 ug | ORAL_TABLET | Freq: Every day | ORAL | Status: DC
  Administered 2015-10-23: 1000 ug via ORAL
  Filled 2015-10-22: qty 1

## 2015-10-22 MED ORDER — CARVEDILOL 6.25 MG PO TABS
6.2500 mg | ORAL_TABLET | Freq: Two times a day (BID) | ORAL | Status: DC
  Administered 2015-10-22 – 2015-10-23 (×2): 6.25 mg via ORAL
  Filled 2015-10-22 (×2): qty 1

## 2015-10-22 MED ORDER — SENNA 8.6 MG PO TABS
1.0000 | ORAL_TABLET | ORAL | Status: DC
  Filled 2015-10-22: qty 1

## 2015-10-22 MED ORDER — SERTRALINE HCL 50 MG PO TABS
50.0000 mg | ORAL_TABLET | Freq: Every day | ORAL | Status: DC
  Administered 2015-10-22: 23:00:00 50 mg via ORAL
  Filled 2015-10-22: qty 1

## 2015-10-22 MED ORDER — LISINOPRIL 2.5 MG PO TABS
2.5000 mg | ORAL_TABLET | Freq: Every day | ORAL | Status: DC
  Administered 2015-10-23: 10:00:00 2.5 mg via ORAL
  Filled 2015-10-22: qty 1

## 2015-10-22 MED ORDER — ACETAMINOPHEN 650 MG RE SUPP: 650.0000 mg | Freq: Four times a day (QID) | RECTAL | Status: DC | PRN

## 2015-10-22 MED ORDER — DEXTROSE 5 % IV SOLN
1.0000 g | Freq: Once | INTRAVENOUS | Status: AC
  Administered 2015-10-22: 1 g via INTRAVENOUS
  Filled 2015-10-22: qty 10

## 2015-10-22 MED ORDER — ACETAMINOPHEN 325 MG PO TABS
650.0000 mg | ORAL_TABLET | Freq: Four times a day (QID) | ORAL | Status: DC | PRN
Start: 2015-10-22 — End: 2015-10-23

## 2015-10-22 NOTE — H&P (Signed)
[employer] -  at [facility]   PATIENT NAME: Kelli Valenzuela    MR#:  [id number]  DATE OF BIRTH:  [birth year]  DATE OF ADMISSION:  10/22/2015  PRIMARY CARE PHYSICIAN: Pcp Not In System   REQUESTING/REFERRING PHYSICIAN: Dr. Jene Every  CHIEF COMPLAINT:   Chief Complaint  Patient presents with  . Hypoglycemia    HISTORY OF PRESENT ILLNESS:  Kelli Valenzuela  is a 82 y.o. female with a known history of Diabetes type 2 without complication, dementia, depression, history of coronary artery disease, hyperlipidemia who presents to the hospital due to lethargy, altered mental status and noted to be hypoglycemic. Patient was recently in the hospital for a hip fracture and discharged to a skilled nursing facility and now has returned back to her assisted living. Her insulin prior to her previous discharge was adjusted and lowered. As per the daughter who gives most of the history Dr's making house calls at the assisted living have recently increased her insulin as her blood sugars were running high. She has not been eating and drinking well for the past couple days and today was noted to be hypoglycemic and therefore sent to the ER. Patient was given some dextrose and also given something to eat and her blood sugars improved improve but have continued to trend downwards again. Hospitalist services were contacted further treatment and evaluation.  PAST MEDICAL HISTORY:   Past Medical History:  Diagnosis Date  . Coronary artery disease    s/p MI  . Dementia   . Depression   . Diabetes mellitus without complication (HCC)    secondary to gallstone pancreatitis and subsequent pancreatitc destruction  . Hyperlipidemia     PAST SURGICAL HISTORY:   Past Surgical History:  Procedure Laterality Date  . EXPLORATORY LAPAROTOMY  1990   multiple  . INTRAMEDULLARY (IM) NAIL INTERTROCHANTERIC Left 09/12/2015   Procedure: INTRAMEDULLARY (IM) NAIL INTERTROCHANTRIC;  Surgeon: Juanell Fairly, MD;  Location: [facility] ORS;  Service: Orthopedics;  Laterality: Left;  . PERCUTANEOUS CORONARY STENT INTERVENTION (PCI-S)     LAD    SOCIAL HISTORY:   Social History  Substance Use Topics  . Smoking status: Never Smoker  . Smokeless tobacco: Never Used  . Alcohol use No    FAMILY HISTORY:   Family History  Problem Relation Age of Onset  . Ovarian cancer Mother   . Esophageal varices Father     DRUG ALLERGIES:   Allergies  Allergen Reactions  . Sulfa Antibiotics Hives    REVIEW OF SYSTEMS:   Review of Systems  Unable to perform ROS: Mental acuity    MEDICATIONS AT HOME:   Prior to Admission medications   Medication Sig Start Date End Date Taking? Authorizing Provider  acetaminophen (TYLENOL) 500 MG tablet Take 500 mg by mouth 3 (three) times daily.   Yes Historical Provider, MD  carvedilol (COREG) 6.25 MG tablet Take 6.25 mg by mouth 2 (two) times daily with a meal.   Yes Historical Provider, MD  clopidogrel (PLAVIX) 75 MG tablet Take 75 mg by mouth daily.   Yes Historical Provider, MD  docusate sodium (COLACE) 100 MG capsule Take 100 mg by mouth 2 (two) times daily.   Yes Historical Provider, MD  ferrous sulfate 325 (65 FE) MG tablet Take 1 tablet (325 mg total) by mouth 3 (three) times daily after meals. 09/14/15  Yes Enedina Finner, MD  insulin aspart (NOVOLOG) 100 UNIT/ML injection Inject 3 Units into the skin 3 (three) times daily with  meals. Patient taking differently: Inject 5 Units into the skin 3 (three) times daily with meals.  09/14/15  Yes Enedina Finner, MD  insulin glargine (LANTUS) 100 UNIT/ML injection Inject 0.18 mLs (18 Units total) into the skin at bedtime. Patient taking differently: Inject 5-15 Units into the skin 2 (two) times daily. Inject 15 units sub-q at 0800 and inject 5 units sub-q at 2000 09/14/15  Yes Enedina Finner, MD  lisinopril (PRINIVIL,ZESTRIL) 2.5 MG tablet Take 2.5 mg by mouth daily.   Yes Historical Provider, MD  meloxicam (MOBIC) 7.5 MG  tablet Take 7.5 mg by mouth daily.   Yes Historical Provider, MD  metFORMIN (GLUCOPHAGE) 500 MG tablet Take 1,000 mg by mouth daily with breakfast.   Yes Historical Provider, MD  senna (SENOKOT) 8.6 MG tablet Take 1 tablet by mouth 3 (three) times a week. Take on Monday, Wednesday, Saturday   Yes Historical Provider, MD  sertraline (ZOLOFT) 50 MG tablet Take 50 mg by mouth daily at 8 pm.   Yes Historical Provider, MD  vitamin B-12 (CYANOCOBALAMIN) 1000 MCG tablet Take 1,000 mcg by mouth daily.   Yes Historical Provider, MD  Vitamin D, Ergocalciferol, (DRISDOL) 50000 units CAPS capsule Take 50,000 Units by mouth every 30 (thirty) days.   Yes Historical Provider, MD  buPROPion (WELLBUTRIN) 100 MG tablet Take 100 mg by mouth every other day.    Historical Provider, MD  enoxaparin (LOVENOX) 40 MG/0.4ML injection Inject 0.4 mLs (40 mg total) into the skin daily. Patient not taking: Reported on 10/22/2015 09/14/15   Enedina Finner, MD  traMADol (ULTRAM) 50 MG tablet Take 1 tablet (50 mg total) by mouth every 6 (six) hours as needed for moderate pain. Patient not taking: Reported on 10/22/2015 09/14/15   Enedina Finner, MD      VITAL SIGNS:  Blood pressure 132/62, pulse 71, temperature (!) 96.2 F (35.7 C), temperature source Rectal, resp. rate 14, SpO2 100 %.  PHYSICAL EXAMINATION:  Physical Exam  GENERAL:  82 y.o.-year-old patient lying in the bed in no acute distress.  EYES: Pupils equal, round, reactive to light and accommodation. No scleral icterus. Extraocular muscles intact.  HEENT: Head atraumatic, normocephalic. Oropharynx and nasopharynx clear. No oropharyngeal erythema, dry oral mucosa  NECK:  Supple, no jugular venous distention. No thyroid enlargement, no tenderness.  LUNGS: Normal breath sounds bilaterally, no wheezing, rales, rhonchi. No use of accessory muscles of respiration.  CARDIOVASCULAR: S1, S2 RRR. No murmurs, rubs, gallops, clicks.  ABDOMEN: Soft, nontender, nondistended. Bowel sounds  present. No organomegaly or mass.  EXTREMITIES: No pedal edema, cyanosis, or clubbing. + 2 pedal & radial pulses b/l.   NEUROLOGIC: Cranial nerves II through XII are intact. No focal Motor or sensory deficits appreciated b/l PSYCHIATRIC: The patient is alert and oriented x 1. Good affect.  SKIN: No obvious rash, lesion, or ulcer.   LABORATORY PANEL:   CBC  Recent Labs Lab 10/22/15 1707  WBC 10.3  HGB 10.6*  HCT 31.1*  PLT 337   ------------------------------------------------------------------------------------------------------------------  Chemistries   Recent Labs Lab 10/22/15 1707  NA 135  K 3.8  CL 101  CO2 26  GLUCOSE 145*  BUN 32*  CREATININE 1.02*  CALCIUM 8.7*  AST 16  ALT 9*  ALKPHOS 98  BILITOT 0.5   ------------------------------------------------------------------------------------------------------------------  Cardiac Enzymes No results for input(s): TROPONINI in the last 168 hours. ------------------------------------------------------------------------------------------------------------------  RADIOLOGY:  Dg Chest Portable 1 View  Result Date: 10/22/2015 CLINICAL DATA:  Elevated glucose.  Nonsmoker. EXAM: PORTABLE CHEST  1 VIEW COMPARISON:  09/11/15 FINDINGS: Previous median sternotomy and CABG procedure. There is aortic atherosclerosis noted. No pleural effusion or edema. No airspace consolidation. IMPRESSION: 1. Aortic atherosclerosis. 2. No acute findings. Electronically Signed   By: Signa Kellaylor  Stroud M.D.   On: 10/22/2015 17:59     IMPRESSION AND PLAN:   82 year old female with past history dementia, hypertension, diabetes, osteoarthritis, recent hip fracture with replacement, coronary artery disease who presented to the hospital due to weakness lethargy and noted to have hypoglycemia. Next  1. Hypoglycemia-this causes patient's lethargy and altered mental status. -Patient received some dextrose and started on a D5 drip. -I will hold her  insulin, metformin. Discussed with daughter at bedside that we probably need to adjust the patient's insulin prior to discharge. It will be better for the patient to be more on the hyperglycemic side and so the hypoglycemic. I will get a diabetes coronary consult.  2. Essential hypertension-continue Coreg, lisinopril.  3. Depression-continue Zoloft.  4. History of osteoarthritis-continue meloxicam.    All the records are reviewed and case discussed with ED provider. Management plans discussed with the patient, family and they are in agreement.  CODE STATUS: DO NOT RESUSCITATE  TOTAL TIME TAKING CARE OF THIS PATIENT: 45 minutes.    Houston SirenSAINANI,Beckey Polkowski J M.D on 10/22/2015 at 8:32 PM  Between 7am to 6pm - Pager - [phone]  After 6pm go to [email or web address] - password [email or web address]  [facility] [place] [facility]  Office  [phone]  CC: Primary care physician; Pcp Not In System

## 2015-10-22 NOTE — ED Triage Notes (Signed)
[facility] Received insulin but has not eaten. Blood glucose 26. D50 by EMS. Blood glucose to 461.

## 2015-10-22 NOTE — ED Notes (Signed)
Pt eating meal tray. Daughter at bedside. 

## 2015-10-22 NOTE — ED Provider Notes (Signed)
[facility] Emergency Department Provider Note   ____________________________________________    I have reviewed the triage vital signs and the nursing notes.   HISTORY  Chief Complaint Hypoglycemia  Patient with history of dementia unable to provide additional history.  HPI Kelli Valenzuela is a 82 y.o. female who presents with altered mental status. Per EMS patient was given insulin but did not eat anything and was found to have a blood glucose of less than 30. EMS gave him an epi D50 and glucose improved to over 400. Patient's medical record shows the patient takes Lantus and NovoLog and no oral agents. No other history is reported.   Past Medical History:  Diagnosis Date  . Coronary artery disease    s/p MI  . Dementia   . Depression   . Diabetes mellitus without complication (HCC)    secondary to gallstone pancreatitis and subsequent pancreatitc destruction  . Hyperlipidemia     Patient Active Problem List   Diagnosis Date Noted  . Hip fracture (HCC) 09/11/2015  . Hypoglycemia 09/08/2015    Past Surgical History:  Procedure Laterality Date  . EXPLORATORY LAPAROTOMY  1990   multiple  . INTRAMEDULLARY (IM) NAIL INTERTROCHANTERIC Left 09/12/2015   Procedure: INTRAMEDULLARY (IM) NAIL INTERTROCHANTRIC;  Surgeon: Juanell Fairly, MD;  Location: [facility] ORS;  Service: Orthopedics;  Laterality: Left;  . PERCUTANEOUS CORONARY STENT INTERVENTION (PCI-S)     LAD    Prior to Admission medications   Medication Sig Start Date End Date Taking? Authorizing Provider  acetaminophen (TYLENOL) 500 MG tablet Take 500 mg by mouth 3 (three) times daily.   Yes Historical Provider, MD  carvedilol (COREG) 6.25 MG tablet Take 6.25 mg by mouth 2 (two) times daily with a meal.   Yes Historical Provider, MD  clopidogrel (PLAVIX) 75 MG tablet Take 75 mg by mouth daily.   Yes Historical Provider, MD  docusate sodium (COLACE) 100 MG capsule Take 100 mg by mouth 2 (two)  times daily.   Yes Historical Provider, MD  ferrous sulfate 325 (65 FE) MG tablet Take 1 tablet (325 mg total) by mouth 3 (three) times daily after meals. 09/14/15  Yes Enedina Finner, MD  insulin aspart (NOVOLOG) 100 UNIT/ML injection Inject 3 Units into the skin 3 (three) times daily with meals. Patient taking differently: Inject 5 Units into the skin 3 (three) times daily with meals.  09/14/15  Yes Enedina Finner, MD  insulin glargine (LANTUS) 100 UNIT/ML injection Inject 0.18 mLs (18 Units total) into the skin at bedtime. Patient taking differently: Inject 5-15 Units into the skin 2 (two) times daily. Inject 15 units sub-q at 0800 and inject 5 units sub-q at 2000 09/14/15  Yes Enedina Finner, MD  lisinopril (PRINIVIL,ZESTRIL) 2.5 MG tablet Take 2.5 mg by mouth daily.   Yes Historical Provider, MD  meloxicam (MOBIC) 7.5 MG tablet Take 7.5 mg by mouth daily.   Yes Historical Provider, MD  metFORMIN (GLUCOPHAGE) 500 MG tablet Take 1,000 mg by mouth daily with breakfast.   Yes Historical Provider, MD  senna (SENOKOT) 8.6 MG tablet Take 1 tablet by mouth 3 (three) times a week. Take on Monday, Wednesday, Saturday   Yes Historical Provider, MD  sertraline (ZOLOFT) 50 MG tablet Take 50 mg by mouth daily at 8 pm.   Yes Historical Provider, MD  vitamin B-12 (CYANOCOBALAMIN) 1000 MCG tablet Take 1,000 mcg by mouth daily.   Yes Historical Provider, MD  Vitamin D, Ergocalciferol, (DRISDOL) 50000 units CAPS capsule  Take 50,000 Units by mouth every 30 (thirty) days.   Yes Historical Provider, MD  buPROPion (WELLBUTRIN) 100 MG tablet Take 100 mg by mouth every other day.    Historical Provider, MD  enoxaparin (LOVENOX) 40 MG/0.4ML injection Inject 0.4 mLs (40 mg total) into the skin daily. Patient not taking: Reported on 10/22/2015 09/14/15   Enedina FinnerSona Patel, MD  traMADol (ULTRAM) 50 MG tablet Take 1 tablet (50 mg total) by mouth every 6 (six) hours as needed for moderate pain. Patient not taking: Reported on 10/22/2015 09/14/15    Enedina FinnerSona Patel, MD     Allergies Sulfa antibiotics  Family History  Problem Relation Age of Onset  . Ovarian cancer Mother   . Esophageal varices Father     Social History Social History  Substance Use Topics  . Smoking status: Never Smoker  . Smokeless tobacco: Never Used  . Alcohol use No    Level V caveat: Unable to obtain Review of Systems due to dementia/altered mental status  ____________________________________________   PHYSICAL EXAM:  VITAL SIGNS: ED Triage Vitals  Enc Vitals Group     BP      Pulse      Resp      Temp      Temp src      SpO2      Weight      Height      Head Circumference      Peak Flow      Pain Score      Pain Loc      Pain Edu?      Excl. in GC?     Constitutional: Arousable, disoriented  Head: Atraumatic. Nose: No congestion/rhinnorhea. Mouth/Throat: Mucous membranes are moist.    Cardiovascular: Normal rate, regular rhythm. Good peripheral circulation. Respiratory: Normal respiratory effort.  No retractions.  Gastrointestinal: Soft and nontender. No distention.  No CVA tenderness. Genitourinary: deferred Musculoskeletal:   Warm and well perfused  Skin:  Skin is warm, dry and intact. No rash noted.   ____________________________________________   LABS (all labs ordered are listed, but only abnormal results are displayed)  Labs Reviewed  GLUCOSE, CAPILLARY - Abnormal; Notable for the following:       Result Value   Glucose-Capillary 138 (*)    All other components within normal limits  CBC WITH DIFFERENTIAL/PLATELET - Abnormal; Notable for the following:    RBC 3.53 (*)    Hemoglobin 10.6 (*)    HCT 31.1 (*)    RDW 16.6 (*)    Neutro Abs 8.1 (*)    All other components within normal limits  COMPREHENSIVE METABOLIC PANEL - Abnormal; Notable for the following:    Glucose, Bld 145 (*)    BUN 32 (*)    Creatinine, Ser 1.02 (*)    Calcium 8.7 (*)    Albumin 3.0 (*)    ALT 9 (*)    GFR calc non Af Amer 48 (*)     GFR calc Af Amer 56 (*)    All other components within normal limits  URINALYSIS COMPLETEWITH MICROSCOPIC ([facility] ONLY) - Abnormal; Notable for the following:    Color, Urine AMBER (*)    APPearance CLOUDY (*)    Ketones, ur TRACE (*)    Hgb urine dipstick 1+ (*)    Protein, ur 30 (*)    Leukocytes, UA 3+ (*)    Bacteria, UA RARE (*)    Squamous Epithelial / LPF 0-5 (*)    All other  components within normal limits  GLUCOSE, CAPILLARY - Abnormal; Notable for the following:    Glucose-Capillary 144 (*)    All other components within normal limits  URINE CULTURE  GLUCOSE, CAPILLARY  GLUCOSE, CAPILLARY  GLUCOSE, CAPILLARY   ____________________________________________  EKG  None ____________________________________________  RADIOLOGY  Chest x-ray no acute distress ____________________________________________   PROCEDURES  Procedure(s) performed: No    Critical Care performed:no ____________________________________________   INITIAL IMPRESSION / ASSESSMENT AND PLAN / ED COURSE  Pertinent labs & imaging results that were available during my care of the patient were reviewed by me and considered in my medical decision making (see chart for details).  Patient presents with hypoglycemia. She received D50 from EMS with rapid increase in glucose which then fell rapidly in the emergency department. Despite eating and drinking orange juice her glucose continued to fall, I have started her on D5 half normal drip. We did give IV antibiotics for her urinary tract infection as well. She will require admission for glucose monitoring  Clinical Course   ____________________________________________   FINAL CLINICAL IMPRESSION(S) / ED DIAGNOSES  Final diagnoses:  Hypoglycemia      NEW MEDICATIONS STARTED DURING THIS VISIT:  New Prescriptions   No medications on file     Note:  This document was prepared using Dragon voice recognition software and may include  unintentional dictation errors.    Jene Every, MD 10/22/15 [id number]

## 2015-10-22 NOTE — ED Notes (Signed)
Report attempted x 1

## 2015-10-23 DIAGNOSIS — E11649 Type 2 diabetes mellitus with hypoglycemia without coma: Secondary | ICD-10-CM | POA: Diagnosis not present

## 2015-10-23 DIAGNOSIS — L8990 Pressure ulcer of unspecified site, unspecified stage: Secondary | ICD-10-CM | POA: Insufficient documentation

## 2015-10-23 LAB — CBC
HCT: 31 % — ABNORMAL LOW (ref 35.0–47.0)
Hemoglobin: 10.5 g/dL — ABNORMAL LOW (ref 12.0–16.0)
MCH: 29.7 pg (ref 26.0–34.0)
MCHC: 33.7 g/dL (ref 32.0–36.0)
MCV: 88.1 fL (ref 80.0–100.0)
PLATELETS: 306 10*3/uL (ref 150–440)
RBC: 3.52 MIL/uL — ABNORMAL LOW (ref 3.80–5.20)
RDW: 16.7 % — AB (ref 11.5–14.5)
WBC: 10.6 10*3/uL (ref 3.6–11.0)

## 2015-10-23 LAB — BASIC METABOLIC PANEL
Anion gap: 8 (ref 5–15)
BUN: 25 mg/dL — ABNORMAL HIGH (ref 6–20)
CO2: 25 mmol/L (ref 22–32)
Calcium: 8.5 mg/dL — ABNORMAL LOW (ref 8.9–10.3)
Chloride: 102 mmol/L (ref 101–111)
Creatinine, Ser: 0.57 mg/dL (ref 0.44–1.00)
GFR calc Af Amer: >60 mL/min (ref >60)
GFR calc non Af Amer: >60 mL/min (ref >60)
GLUCOSE: 166 mg/dL — AB (ref 65–99)
Potassium: 4.2 mmol/L (ref 3.5–5.1)
Sodium: 135 mmol/L (ref 135–145)

## 2015-10-23 LAB — GLUCOSE, CAPILLARY
GLUCOSE-CAPILLARY: 184 mg/dL — AB (ref 65–99)
GLUCOSE-CAPILLARY: 286 mg/dL — AB (ref 65–99)
Glucose-Capillary: 114 mg/dL — ABNORMAL HIGH (ref 65–99)
Glucose-Capillary: 142 mg/dL — ABNORMAL HIGH (ref 65–99)
Glucose-Capillary: 318 mg/dL — ABNORMAL HIGH (ref 65–99)

## 2015-10-23 LAB — MRSA PCR SCREENING: MRSA by PCR: NEGATIVE

## 2015-10-23 MED ORDER — INSULIN ASPART 100 UNIT/ML ~~LOC~~ SOLN: 0.0000 [IU] | Freq: Every day | SUBCUTANEOUS | Status: DC

## 2015-10-23 MED ORDER — INSULIN GLARGINE 100 UNIT/ML ~~LOC~~ SOLN: 10.0000 [IU] | Freq: Every day | SUBCUTANEOUS | 0 refills | Status: DC

## 2015-10-23 MED ORDER — INSULIN ASPART 100 UNIT/ML ~~LOC~~ SOLN
0.0000 [IU] | Freq: Three times a day (TID) | SUBCUTANEOUS | Status: DC
  Administered 2015-10-23: 7 [IU] via SUBCUTANEOUS
  Filled 2015-10-23: qty 7

## 2015-10-23 MED ORDER — INSULIN GLARGINE 100 UNIT/ML ~~LOC~~ SOLN: 15.0000 [IU] | Freq: Every day | SUBCUTANEOUS | 0 refills | Status: DC

## 2015-10-23 NOTE — Care Management Obs Status (Signed)
MEDICARE OBSERVATION STATUS NOTIFICATION   Patient Details  Name: Kelli Valenzuela MRN: [id number] Date of Birth: 12/23/1925   Medicare Observation Status Notification Given:  Yes    Gwenette GreetBrenda S Brandley Aldrete, RN 10/23/2015, 10:46 AM

## 2015-10-23 NOTE — Progress Notes (Signed)
CS[facility] FL[facility] [phone]. Created discharge packet with DNR. Gave it to RN and informed her that CSW has completed all required tasks and [facility].  Woodroe Modehristina Ansel Ferrall, MSW, LCSW, LCAS-A Clinical Social Worker [phone]

## 2015-10-23 NOTE — [place] FL2 (Signed)
[place] MEDICAID FL2 LEVEL OF CARE SCREENING TOOL     IDENTIFICATION  Patient Name: Kelli Valenzuela Birthdate: 01/20/1926 Sex: female Admission Date (Current Location): 10/22/2015  [place]   Facility and Address:  [facility], [address], [place], [place] [address]      Provider Number: [id number]  Attending Physician Name and Address:  Shaune PollackQing Chen, MD  Relative Name and Phone Number:       Current Level of Care: Hospital Recommended Level of Care: Assisted Living Facility [facility](Memory Care) Prior Approval Number:    Date Approved/Denied:   PASRR Number:  [id number])  Discharge Plan: Domiciliary (Rest home) (Memory Care)    Current Diagnoses: Patient Active Problem List   Diagnosis Date Noted  . Pressure injury of skin 10/23/2015  . Hip fracture (HCC) 09/11/2015  . Hypoglycemia 09/08/2015    Orientation RESPIRATION BLADDER Height & Weight     Self, Place, Situation  Normal Incontinent Weight: 131 lb 1.6 oz (59.5 kg) Height:  5\' 3"  (160 cm)  BEHAVIORAL SYMPTOMS/MOOD NEUROLOGICAL BOWEL NUTRITION STATUS   (None)  (None) Continent Diet (heart healthy/carb modified )  AMBULATORY STATUS COMMUNICATION OF NEEDS Skin   Total Care Verbally Other (Comment) (Pressure Ulcer Unstageable- Left Heel)                       Personal Care Assistance Level of Assistance  Feeding, Bathing, Dressing Bathing Assistance: Maximum assistance Feeding assistance: Limited assistance Dressing Assistance: Maximum assistance     Functional Limitations Info  Sight, Hearing, Speech Sight Info: Impaired Hearing Info: Adequate Speech Info: Adequate    SPECIAL CARE FACTORS FREQUENCY                       Contractures      Additional Factors Info  Code Status, Allergies, Insulin Sliding Scale Code Status Info:  (DNR) Allergies Info:  (Sulfa Antibiotics)   Insulin Sliding Scale Info: insulin aspart (novoLOG) injection  0-5 Units 0-5 Units, Subcutaneous, Daily at bedtime  ; insulin aspart (novoLOG) injection 0-9 Units 0-9 Units, Subcutaneous, 3 times daily with meals          Discharge Medications: DISCHARGE MEDICATIONS:     Medication List    STOP taking these medications   insulin aspart 100 UNIT/ML injection Commonly known as:  novoLOG    TAKE these medications   acetaminophen 500 MG tablet Commonly known as:  TYLENOL Take 500 mg by mouth 3 (three) times daily.  buPROPion 100 MG tablet Commonly known as:  WELLBUTRIN Take 100 mg by mouth every other day.  carvedilol 6.25 MG tablet Commonly known as:  COREG Take 6.25 mg by mouth 2 (two) times daily with a meal.  clopidogrel 75 MG tablet Commonly known as:  PLAVIX Take 75 mg by mouth daily.  docusate sodium 100 MG capsule Commonly known as:  COLACE Take 100 mg by mouth 2 (two) times daily.  enoxaparin 40 MG/0.4ML injection Commonly known as:  LOVENOX Inject 0.4 mLs (40 mg total) into the skin daily.  ferrous sulfate 325 (65 FE) MG tablet Take 1 tablet (325 mg total) by mouth 3 (three) times daily after meals.  insulin glargine 100 UNIT/ML injection Commonly known as:  LANTUS Inject 0.15 mLs (15 Units total) into the skin at bedtime. What changed:  how much to take  lisinopril 2.5 MG tablet Commonly known as:  PRINIVIL,ZESTRIL Take 2.5 mg by mouth daily.  meloxicam 7.5 MG tablet Commonly known as:  MOBIC Take 7.5 mg by mouth daily.  metFORMIN 500 MG tablet Commonly known as:  GLUCOPHAGE Take 1,000 mg by mouth daily with breakfast.  senna 8.6 MG tablet Commonly known as:  SENOKOT Take 1 tablet by mouth 3 (three) times a week. Take on Monday, Wednesday, Saturday  sertraline 50 MG tablet Commonly known as:  ZOLOFT Take 50 mg by mouth daily at 8 pm.  traMADol 50 MG tablet Commonly known as:  ULTRAM Take 1 tablet (50 mg total) by mouth every 6 (six) hours as needed for moderate pain.  vitamin B-12 1000 MCG tablet Commonly  known as:  CYANOCOBALAMIN Take 1,000 mcg by mouth daily.  Vitamin D (Ergocalciferol) 50000 units Caps capsule Commonly known as:  DRISDOL Take 50,000 Units by mouth every 30 (thirty) days.       Relevant Imaging Results:  Relevant Lab Results:   Additional Information  (SSN [id number])  Verta Ellen Aric Jost, LCSW

## 2015-10-23 NOTE — Clinical Social Work Note (Signed)
Clinical Social Work Assessment  Patient Details  Name: Kelli Valenzuela MRN: [id number] Date of Birth: Sep 29, 1925  Date of referral:  10/23/15               Reason for consult:  Discharge Planning                Permission sought to share information with:  Family Supports Permission granted to share information::  Yes, Verbal Permission Granted  Name::        Agency::     Relationship::   [name] - Daughter)  Sport and exercise psychologist Information:     Housing/Transportation Living arrangements for the past 2 months:  [place] ([place]) Source of Information:  Adult Children [name]) Patient Interpreter Needed:  None Criminal Activity/Legal Involvement Pertinent to Current Situation/Hospitalization:  No - Comment as needed Significant Relationships:  Adult Children, Other Family Members Lives with:  Facility Resident ([place]) Do you feel safe going back to the place where you live?  Yes Need for family participation in patient care:  Yes (Comment) (Daughter- Kelli Valenzuela)  Care giving concerns:  Patient is a resident at [facility].    Social Worker assessment / plan:  CSW and CSW Intern met with patient and her daughter at bedside. Introduced herself and her role. Per patient's daughter patient is a resident at [facility]. Reported that she has been a resident at [facility] for the past year. Stated that patient is a resident of their Memory Care Unit. Reported that she would like for patient to return at discharge. Stated that she can transport patient at discharge. Stated that she privately pays for [facility]. Granted CSW permission to coordinate discharge with [facility].  CSW spoke to [place] at [facility]. Per Joycelyn Schmid patient is a resident of the Memory Care Unit and is total care. Stated she uses a wheelchair and sometimes feeds herself. Reported that patient can return at discharge. Provided fax number for updated FL2 and discharge summary  [phone].   CSW spoke to MD. Per MD patient to be discharged later this afternoon. CSW updated Douglass Rivers CSW will continue to follow and assist.   Employment status:  Retired Insurance underwriter information:  Medicare PT Recommendations:  Not assessed at this time Information / Referral to community resources:     Patient/Family's Response to care:  Patient's daughter is in agreement for patient to return to [facility].   Patient/Family's Understanding of and Emotional Response to Diagnosis, Current Treatment, and Prognosis:  Patient's daughter reported that she understands patient's Diagnosis, Current Treatment, and Prognosis. Thanked CSW for assistance.   Emotional Assessment Appearance:  Appears stated age Attitude/Demeanor/Rapport:   (None) Affect (typically observed):  Calm, Pleasant Orientation:  Oriented to Self, Oriented to Place, Oriented to  Time Alcohol / Substance use:  Not Applicable Psych involvement (Current and /or in the community):  No (Comment)  Discharge Needs  Concerns to be addressed:  Discharge Planning Concerns Readmission within the last 30 days:  No Current discharge risk:  Chronically ill Barriers to Discharge:  Continued Medical Work up   [employer], LCSW 10/23/2015, 11:07 AM

## 2015-10-23 NOTE — Progress Notes (Signed)
[facility]  Patient's BS has stabilized and sliding scale resumed. Was 318 at lunch and 286 after she received sliding scale of 7 units. Patient was tearful and depressed.  Daughter is here and she ate her breakfast and lunch and she'll be taking [facility]. IVs removed and d/c paper work given to daughter.

## 2015-10-23 NOTE — Progress Notes (Signed)
Inpatient Diabetes Program Recommendations  AACE/ADA: New Consensus Statement on Inpatient Glycemic Control (2015)  Target Ranges:  Prepandial:   less than 140 mg/dL      Peak postprandial:   less than 180 mg/dL (1-2 hours)      Critically ill patients:  140 - 180 mg/dL   Results for Kelli Valenzuela, [name] (MRN [id number]) as of 10/23/2015 09:08  Ref. Range 10/22/2015 16:56 10/22/2015 17:13 10/22/2015 17:52 10/22/2015 18:44 10/22/2015 18:57 10/22/2015 19:53 10/22/2015 21:10 10/23/2015 00:02 10/23/2015 03:55 10/23/2015 07:56  Glucose-Capillary Latest Ref Range: 65 - 99 mg/dL 644138 (H) 034144 (H) 97 75 69 98 117 (H) 114 (H) 142 (H) 184 (H)   Review of Glycemic Control  Outpatient Diabetes medications: Lantus 15 units daily at 8am, Lantus 5 units daily at 8pm (according to home medication list patient actually prescribed Lantus 18 units QHS), Novolog 5 units TID (prescribed Novolog 3 units TID) with meals, Metformin 1000 mg QAM Current orders for Inpatient glycemic control: CBGs Q4H  Inpatient Diabetes Program Recommendations: Insulin - Basal: Noted patient initially presented with hypoglycemia which has now resolved. Please consider ordering Lantus 10 units QHS. Correction (SSI): Noted patient initially presented with hypoglycemia which has now resolved. Please consider ordering Novolog 0-9 units TID with meals, Novolog 0-5 units QHS. Outpatient Follow Up: Recommend patient follow up with PCP as an outpatient for further adjustments with DM medications.  NOTE: Consult noted for hypoglycemia. Chart reviewed. In reviewing chart noted [facility] and according to ED note by Berle MullV. Chandler, RN on 10/22/15,  patient "Received insulin but has not eaten." Anticipate patient received Novolog meal coverage and did not eat which lead to hypoglycemia. If patient does not eat, then insulin intended for meal coverage should not be given. At time of discharge, if Novolog meal coverage is continued, recommend MD add the  following administration guidelines to Novolog meal coverage:  Assumes patient eating 50 % or more of meal ** * Do NOT give if CBG < 80 or if patient is on premixed insulin. * Do NOT give if meal intake is less than 50 %. * Give correction and meal coverage insulin with or immediately after the meal.  Thanks, Orlando PennerMarie Addalyne Vandehei, RN, MSN, CDE Diabetes Coordinator Inpatient Diabetes Program [phone] (Team Pager from 8am to 5pm) [phone] ([facility] office) [phone] [facility] office) [phone] [facility] office)

## 2015-10-23 NOTE — Progress Notes (Signed)
[facility] Introductory visit. Met Pt and daughter. Pt. Hopes to go home today. Provided Prayer of celebration with Pt and daughter.    10/23/15 1200  Clinical Encounter Type  Visited With Patient;Patient and family together  Visit Type Initial;Spiritual support  Referral From Nurse  Spiritual Encounters  Spiritual Needs Prayer

## 2015-10-23 NOTE — Plan of Care (Signed)
Problem: Skin Integrity: Goal: Risk for impaired skin integrity will decrease Outcome: Progressing Repositioning rendered. Foam applied to bilateral heels. Rest and comforts maintained. Kept safe and comfortable.  Problem: Metabolic: Goal: Ability to maintain appropriate glucose levels will improve Outcome: Progressing Patient is being admitted for low blood sugar. Blood sugar checking every 4 hours. Latest CBG is 114 mg/dl with IVF D5 1/2 NS infusing at 7550ml/hr. Needs attended. Kept closely monitored for signs of hypoglycemia.

## 2015-10-23 NOTE — Discharge Instructions (Signed)
Heart healthy and ADA diet. Activity as tolerated. Fall precaution.  administration guidelines to Novolog meal coverage:  Assumes patient eating 50 % or more of meal ** * Do NOT give if CBG < 80 or if patient is on premixed insulin. * Do NOT give if meal intake is less than 50 %. * Give correction and meal coverage insulin with or immediately after the meal.

## 2015-10-23 NOTE — Discharge Summary (Addendum)
[facility] -  at [facility]   PATIENT NAME: Kelli Valenzuela    MR#:  [id number]  DATE OF BIRTH:  05/25/1925  DATE OF ADMISSION:  10/22/2015   ADMITTING PHYSICIAN: Houston Siren, MD  DATE OF DISCHARGE: 10/23/2015 PRIMARY CARE PHYSICIAN: Pcp Not In System   ADMISSION DIAGNOSIS:  Hypoglycemia [E16.2] DISCHARGE DIAGNOSIS:  Active Problems:   Hypoglycemia   Pressure injury of skin  SECONDARY DIAGNOSIS:   Past Medical History:  Diagnosis Date  . Coronary artery disease    s/p MI  . Dementia   . Depression   . Diabetes mellitus without complication (HCC)    secondary to gallstone pancreatitis and subsequent pancreatitc destruction  . Hyperlipidemia    HOSPITAL COURSE:  82 year old female with past history dementia, hypertension, diabetes, osteoarthritis, recent hip fracture with replacement, coronary artery disease who presented to the hospital due to weakness lethargy and noted to have hypoglycemia.   1. Hypoglycemia with uncontrolled DM2.-this causes patient's lethargy and altered mental status. -Patient received some dextrose and started on a D5 drip.  her insulin and metformin were hold.  Since BS is elevated so D5 was discontinued and SL is started this am. BS was up to 318 before lunch, down to 286 just now. She needs lantus with decreased dose at 15 units HS without novolog AC. F/u PCP to adjust dose.  2. Essential hypertension-continue Coreg, lisinopril.  3. Depression-continue Zoloft.  4. History of osteoarthritis-continue meloxicam  * Dehydration. Improving.  DISCHARGE CONDITIONS:  Stable, discharge to ALF today. CONSULTS OBTAINED:   DRUG ALLERGIES:   Allergies  Allergen Reactions  . Sulfa Antibiotics Hives   DISCHARGE MEDICATIONS:     Medication List    STOP taking these medications   insulin aspart 100 UNIT/ML injection Commonly known as:  novoLOG     TAKE these medications   acetaminophen 500 MG tablet Commonly  known as:  TYLENOL Take 500 mg by mouth 3 (three) times daily.   buPROPion 100 MG tablet Commonly known as:  WELLBUTRIN Take 100 mg by mouth every other day.   carvedilol 6.25 MG tablet Commonly known as:  COREG Take 6.25 mg by mouth 2 (two) times daily with a meal.   clopidogrel 75 MG tablet Commonly known as:  PLAVIX Take 75 mg by mouth daily.   docusate sodium 100 MG capsule Commonly known as:  COLACE Take 100 mg by mouth 2 (two) times daily.   enoxaparin 40 MG/0.4ML injection Commonly known as:  LOVENOX Inject 0.4 mLs (40 mg total) into the skin daily.   ferrous sulfate 325 (65 FE) MG tablet Take 1 tablet (325 mg total) by mouth 3 (three) times daily after meals.   insulin glargine 100 UNIT/ML injection Commonly known as:  LANTUS Inject 0.15 mLs (15 Units total) into the skin at bedtime. What changed:  how much to take   lisinopril 2.5 MG tablet Commonly known as:  PRINIVIL,ZESTRIL Take 2.5 mg by mouth daily.   meloxicam 7.5 MG tablet Commonly known as:  MOBIC Take 7.5 mg by mouth daily.   metFORMIN 500 MG tablet Commonly known as:  GLUCOPHAGE Take 1,000 mg by mouth daily with breakfast.   senna 8.6 MG tablet Commonly known as:  SENOKOT Take 1 tablet by mouth 3 (three) times a week. Take on Monday, Wednesday, Saturday   sertraline 50 MG tablet Commonly known as:  ZOLOFT Take 50 mg by mouth daily at 8 pm.   traMADol 50 MG tablet Commonly  known as:  ULTRAM Take 1 tablet (50 mg total) by mouth every 6 (six) hours as needed for moderate pain.   vitamin B-12 1000 MCG tablet Commonly known as:  CYANOCOBALAMIN Take 1,000 mcg by mouth daily.   Vitamin D (Ergocalciferol) 50000 units Caps capsule Commonly known as:  DRISDOL Take 50,000 Units by mouth every 30 (thirty) days.        DISCHARGE INSTRUCTIONS:   DIET:  Heart healthy and ADA diet. DISCHARGE CONDITION:  Stable. ACTIVITY:  As tolerated. DISCHARGE LOCATION:    If you experience worsening  of your admission symptoms, develop shortness of breath, life threatening emergency, suicidal or homicidal thoughts you must seek medical attention immediately by calling 911 or calling your MD immediately  if symptoms less severe.  You Must read complete instructions/literature along with all the possible adverse reactions/side effects for all the Medicines you take and that have been prescribed to you. Take any new Medicines after you have completely understood and accpet all the possible adverse reactions/side effects.   Please note  You were cared for by a hospitalist during your hospital stay. If you have any questions about your discharge medications or the care you received while you were in the hospital after you are discharged, you can call the unit and asked to speak with the hospitalist on call if the hospitalist that took care of you is not available. Once you are discharged, your primary care physician will handle any further medical issues. Please note that NO REFILLS for any discharge medications will be authorized once you are discharged, as it is imperative that you return to your primary care physician (or establish a relationship with a primary care physician if you do not have one) for your aftercare needs so that they can reassess your need for medications and monitor your lab values.    On the day of Discharge:  VITAL SIGNS:  Blood pressure (!) 125/58, pulse 93, temperature 99.1 F (37.3 C), temperature source Oral, resp. rate 17, height 5\' 3"  (1.6 m), weight 131 lb 1.6 oz (59.5 kg), SpO2 97 %. PHYSICAL EXAMINATION:  GENERAL:  82 y.o.-year-old patient lying in the bed with no acute distress.  EYES: Pupils equal, round, reactive to light and accommodation. No scleral icterus. Extraocular muscles intact.  HEENT: Head atraumatic, normocephalic. Oropharynx and nasopharynx clear.  NECK:  Supple, no jugular venous distention. No thyroid enlargement, no tenderness.  LUNGS: Normal  breath sounds bilaterally, no wheezing, rales,rhonchi or crepitation. No use of accessory muscles of respiration.  CARDIOVASCULAR: S1, S2 normal. No murmurs, rubs, or gallops.  ABDOMEN: Soft, non-tender, non-distended. Bowel sounds present. No organomegaly or mass.  EXTREMITIES: No pedal edema, cyanosis, or clubbing.  NEUROLOGIC: Cranial nerves II through XII are intact. Muscle strength 4/5 in all extremities. Sensation intact. Gait not checked.  PSYCHIATRIC: The patient is alert but demented, only knows her name.  SKIN: No obvious rash, lesion, or ulcer.  DATA REVIEW:   CBC  Recent Labs Lab 10/23/15 0546  WBC 10.6  HGB 10.5*  HCT 31.0*  PLT 306    Chemistries   Recent Labs Lab 10/22/15 1707 10/23/15 0546  NA 135 135  K 3.8 4.2  CL 101 102  CO2 26 25  GLUCOSE 145* 166*  BUN 32* 25*  CREATININE 1.02* 0.57  CALCIUM 8.7* 8.5*  AST 16  --   ALT 9*  --   ALKPHOS 98  --   BILITOT 0.5  --  Microbiology Results  Results for orders placed or performed during the hospital encounter of 10/22/15  MRSA PCR Screening     Status: None   Collection Time: 10/22/15 10:28 PM  Result Value Ref Range Status   MRSA by PCR NEGATIVE NEGATIVE Final    Comment:        The GeneXpert MRSA Assay (FDA approved for NASAL specimens only), is one component of a comprehensive MRSA colonization surveillance program. It is not intended to diagnose MRSA infection nor to guide or monitor treatment for MRSA infections.     RADIOLOGY:  Dg Chest Portable 1 View  Result Date: 10/22/2015 CLINICAL DATA:  Elevated glucose.  Nonsmoker. EXAM: PORTABLE CHEST 1 VIEW COMPARISON:  09/11/15 FINDINGS: Previous median sternotomy and CABG procedure. There is aortic atherosclerosis noted. No pleural effusion or edema. No airspace consolidation. IMPRESSION: 1. Aortic atherosclerosis. 2. No acute findings. Electronically Signed   By: Signa Kellaylor  Stroud M.D.   On: 10/22/2015 17:59     Management plans  discussed with the patient's daughter and they are in agreement.  CODE STATUS:     Code Status Orders        Start     Ordered   10/22/15 2139  Do not attempt resuscitation (DNR)  Continuous    Question Answer Comment  In the event of cardiac or respiratory ARREST Do not call a "code blue"   In the event of cardiac or respiratory ARREST Do not perform Intubation, CPR, defibrillation or ACLS   In the event of cardiac or respiratory ARREST Use medication by any route, position, wound care, and other measures to relive pain and suffering. May use oxygen, suction and manual treatment of airway obstruction as needed for comfort.      10/22/15 2138    Code Status History    Date Active Date Inactive Code Status Order ID Comments User Context   09/11/2015  5:11 PM 09/12/2015  2:10 PM DNR [id number]  Juanell FairlyKevin Krasinski, MD Inpatient   09/11/2015  5:10 PM 09/11/2015  5:11 PM DNR [id number]  Enid Baasadhika Kalisetti, MD Inpatient   09/08/2015  3:05 AM 09/09/2015  5:11 PM DNR [id number]  Arnaldo NatalMichael S Diamond, MD Inpatient   09/08/2015  2:33 AM 09/08/2015  3:05 AM Full Code [id number]  Arnaldo NatalMichael S Diamond, MD Inpatient    Advance Directive Documentation   Flowsheet Row Most Recent Value  Type of Advance Directive  Healthcare Power of Attorney  Pre-existing out of facility DNR order (yellow form or pink MOST form)  No data  "MOST" Form in Place?  No data      TOTAL TIME TAKING CARE OF THIS PATIENT: 36 minutes.    Shaune Pollackhen, Saahir Prude M.D on 10/23/2015 at 1:39 PM  Between 7am to 6pm - Pager - [phone]  After 6pm go to [email or web address] - Social research officer, governmentpassword [email or web address] [facility]  [facility]  Office  [phone]  CC: Primary care physician; Pcp Not In System   Note: This dictation was prepared with Dragon dictation along with smaller phrase technology. Any transcriptional errors that result from this process are unintentional.

## 2015-11-12 ENCOUNTER — Encounter: Payer: Self-pay | Admitting: Obstetrics and Gynecology

## 2015-11-25 ENCOUNTER — Inpatient Hospital Stay
Admission: EM | Admit: 2015-11-25 | Discharge: 2015-11-27 | DRG: 871 | Disposition: A | Discharge status: Hospice | Payer: Medicare Other | Attending: Internal Medicine | Admitting: Internal Medicine

## 2015-11-25 ENCOUNTER — Encounter: Payer: Self-pay | Admitting: Internal Medicine

## 2015-11-25 ENCOUNTER — Inpatient Hospital Stay: Payer: Medicare Other

## 2015-11-25 ENCOUNTER — Emergency Department: Payer: Medicare Other

## 2015-11-25 DIAGNOSIS — L97529 Non-pressure chronic ulcer of other part of left foot with unspecified severity: Secondary | ICD-10-CM | POA: Diagnosis present

## 2015-11-25 DIAGNOSIS — E1152 Type 2 diabetes mellitus with diabetic peripheral angiopathy with gangrene: Secondary | ICD-10-CM | POA: Diagnosis present

## 2015-11-25 DIAGNOSIS — A4102 Sepsis due to Methicillin resistant Staphylococcus aureus: Principal | ICD-10-CM | POA: Diagnosis present

## 2015-11-25 DIAGNOSIS — Z9889 Other specified postprocedural states: Secondary | ICD-10-CM

## 2015-11-25 DIAGNOSIS — K921 Melena: Secondary | ICD-10-CM | POA: Diagnosis present

## 2015-11-25 DIAGNOSIS — R7989 Other specified abnormal findings of blood chemistry: Secondary | ICD-10-CM

## 2015-11-25 DIAGNOSIS — I2510 Atherosclerotic heart disease of native coronary artery without angina pectoris: Secondary | ICD-10-CM | POA: Diagnosis present

## 2015-11-25 DIAGNOSIS — Z882 Allergy status to sulfonamides status: Secondary | ICD-10-CM

## 2015-11-25 DIAGNOSIS — Z515 Encounter for palliative care: Secondary | ICD-10-CM | POA: Diagnosis present

## 2015-11-25 DIAGNOSIS — Z7984 Long term (current) use of oral hypoglycemic drugs: Secondary | ICD-10-CM | POA: Diagnosis not present

## 2015-11-25 DIAGNOSIS — I252 Old myocardial infarction: Secondary | ICD-10-CM | POA: Diagnosis not present

## 2015-11-25 DIAGNOSIS — R778 Other specified abnormalities of plasma proteins: Secondary | ICD-10-CM

## 2015-11-25 DIAGNOSIS — R6521 Severe sepsis with septic shock: Secondary | ICD-10-CM | POA: Diagnosis present

## 2015-11-25 DIAGNOSIS — L97429 Non-pressure chronic ulcer of left heel and midfoot with unspecified severity: Secondary | ICD-10-CM | POA: Diagnosis present

## 2015-11-25 DIAGNOSIS — R7881 Bacteremia: Secondary | ICD-10-CM | POA: Diagnosis not present

## 2015-11-25 DIAGNOSIS — E46 Unspecified protein-calorie malnutrition: Secondary | ICD-10-CM | POA: Diagnosis present

## 2015-11-25 DIAGNOSIS — R627 Adult failure to thrive: Secondary | ICD-10-CM | POA: Diagnosis present

## 2015-11-25 DIAGNOSIS — D72829 Elevated white blood cell count, unspecified: Secondary | ICD-10-CM

## 2015-11-25 DIAGNOSIS — Z66 Do not resuscitate: Secondary | ICD-10-CM | POA: Diagnosis present

## 2015-11-25 DIAGNOSIS — Z794 Long term (current) use of insulin: Secondary | ICD-10-CM | POA: Diagnosis not present

## 2015-11-25 DIAGNOSIS — R571 Hypovolemic shock: Secondary | ICD-10-CM | POA: Diagnosis present

## 2015-11-25 DIAGNOSIS — A419 Sepsis, unspecified organism: Secondary | ICD-10-CM

## 2015-11-25 DIAGNOSIS — Z79899 Other long term (current) drug therapy: Secondary | ICD-10-CM

## 2015-11-25 DIAGNOSIS — Z993 Dependence on wheelchair: Secondary | ICD-10-CM

## 2015-11-25 DIAGNOSIS — R1311 Dysphagia, oral phase: Secondary | ICD-10-CM

## 2015-11-25 DIAGNOSIS — F0390 Unspecified dementia without behavioral disturbance: Secondary | ICD-10-CM | POA: Diagnosis present

## 2015-11-25 DIAGNOSIS — R197 Diarrhea, unspecified: Secondary | ICD-10-CM | POA: Diagnosis present

## 2015-11-25 DIAGNOSIS — Z800 Family history of malignant neoplasm of digestive organs: Secondary | ICD-10-CM

## 2015-11-25 DIAGNOSIS — Z955 Presence of coronary angioplasty implant and graft: Secondary | ICD-10-CM

## 2015-11-25 DIAGNOSIS — E11621 Type 2 diabetes mellitus with foot ulcer: Secondary | ICD-10-CM | POA: Diagnosis present

## 2015-11-25 DIAGNOSIS — I96 Gangrene, not elsewhere classified: Secondary | ICD-10-CM | POA: Diagnosis present

## 2015-11-25 DIAGNOSIS — G9340 Encephalopathy, unspecified: Secondary | ICD-10-CM | POA: Diagnosis present

## 2015-11-25 DIAGNOSIS — Z6822 Body mass index (BMI) 22.0-22.9, adult: Secondary | ICD-10-CM

## 2015-11-25 DIAGNOSIS — L97409 Non-pressure chronic ulcer of unspecified heel and midfoot with unspecified severity: Secondary | ICD-10-CM

## 2015-11-25 DIAGNOSIS — Z8041 Family history of malignant neoplasm of ovary: Secondary | ICD-10-CM

## 2015-11-25 HISTORY — DX: Peripheral vascular disease, unspecified: I73.9

## 2015-11-25 LAB — CBC WITH DIFFERENTIAL/PLATELET
Basophils Absolute: 0.1 10*3/uL (ref 0–0.1)
Basophils Relative: 0 %
EOS ABS: 0 10*3/uL (ref 0–0.7)
EOS PCT: 0 %
HCT: 29.5 % — ABNORMAL LOW (ref 35.0–47.0)
Hemoglobin: 10.1 g/dL — ABNORMAL LOW (ref 12.0–16.0)
LYMPHS ABS: 1.2 10*3/uL (ref 1.0–3.6)
LYMPHS PCT: 4 %
MCH: 29.4 pg (ref 26.0–34.0)
MCHC: 34.1 g/dL (ref 32.0–36.0)
MCV: 86.3 fL (ref 80.0–100.0)
MONO ABS: 1.1 10*3/uL — AB (ref 0.2–0.9)
Monocytes Relative: 4 %
Neutro Abs: 26.4 10*3/uL — ABNORMAL HIGH (ref 1.4–6.5)
Neutrophils Relative %: 92 %
PLATELETS: 373 10*3/uL (ref 150–440)
RBC: 3.42 MIL/uL — ABNORMAL LOW (ref 3.80–5.20)
RDW: 17 % — AB (ref 11.5–14.5)
WBC: 28.8 10*3/uL — ABNORMAL HIGH (ref 3.6–11.0)

## 2015-11-25 LAB — INFLUENZA PANEL BY PCR (TYPE A & B)
H1N1 flu by pcr: NOT DETECTED
INFLAPCR: NEGATIVE
INFLBPCR: NEGATIVE

## 2015-11-25 LAB — LACTIC ACID, PLASMA
LACTIC ACID, VENOUS: 2.2 mmol/L — AB (ref 0.5–1.9)
Lactic Acid, Venous: 1.1 mmol/L (ref 0.5–1.9)

## 2015-11-25 LAB — URINALYSIS COMPLETE WITH MICROSCOPIC (ARMC ONLY)
Bilirubin Urine: NEGATIVE
Glucose, UA: 150 mg/dL — AB
Hgb urine dipstick: NEGATIVE
Nitrite: NEGATIVE
PH: 5 (ref 5.0–8.0)
PROTEIN: 30 mg/dL — AB
SQUAMOUS EPITHELIAL / LPF: NONE SEEN
Specific Gravity, Urine: 1.018 (ref 1.005–1.030)

## 2015-11-25 LAB — PROTIME-INR
INR: 1.11
Prothrombin Time: 14.3 seconds (ref 11.4–15.2)

## 2015-11-25 LAB — COMPREHENSIVE METABOLIC PANEL
ALBUMIN: 2.6 g/dL — AB (ref 3.5–5.0)
ALT: 21 U/L (ref 14–54)
AST: 34 U/L (ref 15–41)
Alkaline Phosphatase: 118 U/L (ref 38–126)
Anion gap: 12 (ref 5–15)
BILIRUBIN TOTAL: 0.7 mg/dL (ref 0.3–1.2)
BUN: 42 mg/dL — AB (ref 6–20)
CHLORIDE: 102 mmol/L (ref 101–111)
CO2: 23 mmol/L (ref 22–32)
CREATININE: 0.97 mg/dL (ref 0.44–1.00)
Calcium: 8.5 mg/dL — ABNORMAL LOW (ref 8.9–10.3)
GFR calc Af Amer: 59 mL/min — ABNORMAL LOW (ref >60)
GFR, EST NON AFRICAN AMERICAN: 51 mL/min — AB (ref >60)
GLUCOSE: 243 mg/dL — AB (ref 65–99)
POTASSIUM: 3.5 mmol/L (ref 3.5–5.1)
Sodium: 137 mmol/L (ref 135–145)
TOTAL PROTEIN: 6.4 g/dL — AB (ref 6.5–8.1)

## 2015-11-25 LAB — C DIFFICILE QUICK SCREEN W PCR REFLEX
C DIFFICILE (CDIFF) INTERP: NOT DETECTED
C DIFFICILE (CDIFF) TOXIN: NEGATIVE
C DIFFICLE (CDIFF) ANTIGEN: NEGATIVE

## 2015-11-25 LAB — SEDIMENTATION RATE: SED RATE: 15 mm/h (ref 0–30)

## 2015-11-25 LAB — PREPARE RBC (CROSSMATCH)

## 2015-11-25 LAB — TROPONIN I: TROPONIN I: 0.25 ng/mL — AB (ref <0.03)

## 2015-11-25 LAB — MRSA PCR SCREENING: MRSA by PCR: POSITIVE — AB

## 2015-11-25 LAB — HEMOGLOBIN: Hemoglobin: 9.7 g/dL — ABNORMAL LOW (ref 12.0–16.0)

## 2015-11-25 LAB — APTT: aPTT: 35 seconds (ref 24–36)

## 2015-11-25 MED ORDER — SODIUM CHLORIDE 0.9 % IV BOLUS (SEPSIS): 1000.0000 mL | Freq: Once | INTRAVENOUS | Status: DC

## 2015-11-25 MED ORDER — SODIUM CHLORIDE 0.9 % IV BOLUS (SEPSIS)
1000.0000 mL | Freq: Once | INTRAVENOUS | Status: AC
  Administered 2015-11-25: 1000 mL via INTRAVENOUS

## 2015-11-25 MED ORDER — SODIUM CHLORIDE 0.9 % IV BOLUS (SEPSIS)
500.0000 mL | Freq: Once | INTRAVENOUS | Status: AC
  Administered 2015-11-25: 500 mL via INTRAVENOUS

## 2015-11-25 MED ORDER — VANCOMYCIN HCL IN DEXTROSE 750-5 MG/150ML-% IV SOLN
750.0000 mg | INTRAVENOUS | Status: DC
  Administered 2015-11-26 – 2015-11-27 (×2): 750 mg via INTRAVENOUS
  Filled 2015-11-25 (×2): qty 150

## 2015-11-25 MED ORDER — SODIUM CHLORIDE 0.9 % IV BOLUS (SEPSIS): 250.0000 mL | Freq: Once | INTRAVENOUS | Status: DC

## 2015-11-25 MED ORDER — SODIUM CHLORIDE 0.9 % IV SOLN
8.0000 mg/h | INTRAVENOUS | Status: DC
  Administered 2015-11-25 – 2015-11-26 (×2): 8 mg/h via INTRAVENOUS
  Filled 2015-11-25 (×2): qty 80

## 2015-11-25 MED ORDER — CEFEPIME-DEXTROSE 1 GM/50ML IV SOLR
1.0000 g | Freq: Once | INTRAVENOUS | Status: AC
  Administered 2015-11-25: 1 g via INTRAVENOUS
  Filled 2015-11-25: qty 50

## 2015-11-25 MED ORDER — ACETAMINOPHEN 650 MG RE SUPP
650.0000 mg | Freq: Four times a day (QID) | RECTAL | Status: DC | PRN
  Administered 2015-11-26: 650 mg via RECTAL
  Filled 2015-11-25: qty 1

## 2015-11-25 MED ORDER — ACETAMINOPHEN 325 MG PO TABS: 650.0000 mg | ORAL_TABLET | Freq: Four times a day (QID) | ORAL | Status: DC | PRN

## 2015-11-25 MED ORDER — MUPIROCIN 2 % EX OINT
1.0000 "application " | TOPICAL_OINTMENT | Freq: Two times a day (BID) | CUTANEOUS | Status: DC
  Administered 2015-11-25 – 2015-11-26 (×3): 1 via NASAL
  Filled 2015-11-25: qty 22

## 2015-11-25 MED ORDER — SODIUM CHLORIDE 0.9 % IV SOLN
INTRAVENOUS | Status: DC
  Administered 2015-11-26: 03:00:00 via INTRAVENOUS

## 2015-11-25 MED ORDER — VANCOMYCIN HCL IN DEXTROSE 1-5 GM/200ML-% IV SOLN
1000.0000 mg | Freq: Once | INTRAVENOUS | Status: AC
  Administered 2015-11-25: 1000 mg via INTRAVENOUS
  Filled 2015-11-25: qty 200

## 2015-11-25 MED ORDER — CEFEPIME-DEXTROSE 1 GM/50ML IV SOLR
1.0000 g | INTRAVENOUS | Status: DC
  Filled 2015-11-25: qty 50

## 2015-11-25 MED ORDER — SERTRALINE HCL 50 MG PO TABS
50.0000 mg | ORAL_TABLET | Freq: Every day | ORAL | Status: DC
  Administered 2015-11-26: 50 mg via ORAL
  Filled 2015-11-25: qty 1

## 2015-11-25 MED ORDER — CEFEPIME-DEXTROSE 2 GM/50ML IV SOLR
2.0000 g | INTRAVENOUS | Status: DC
  Filled 2015-11-25: qty 50

## 2015-11-25 MED ORDER — PANTOPRAZOLE SODIUM 40 MG IV SOLR
40.0000 mg | Freq: Once | INTRAVENOUS | Status: AC
  Administered 2015-11-25: 40 mg via INTRAVENOUS
  Filled 2015-11-25: qty 40

## 2015-11-25 MED ORDER — CHLORHEXIDINE GLUCONATE CLOTH 2 % EX PADS
6.0000 | MEDICATED_PAD | Freq: Every day | CUTANEOUS | Status: DC
  Administered 2015-11-26 – 2015-11-27 (×2): 6 via TOPICAL

## 2015-11-25 NOTE — Progress Notes (Addendum)
Pharmacy Antibiotic Note  Kelli Valenzuela is a 82 y.o. female admitted on 11/25/2015 with sepsis and pneumonia.  Pharmacy has been consulted for vancomycin & cefepime dosing.  Plan: Patient received vancomycin 1000 mg IV and cefepime 1 g IV x 1 in the ED.   Will order additional cefepime 1g and start cefepime 2g IV Q24hr with next dose on 10/30.   Will continue patient on vancomycin 750 mg IV q 24 hours (stacked dosing) to begin in the am. Trough ordered prior to 5th dose with a goal trough of 15-20. Cmin at steady state estimated to be 15.54 based on total body weight = 59 and CrCl = 37.34. Will monitor renal function and adjust accordingly.  PK VD = 41.3 T1/2 19.8 Ke = 0.035  Height: 5\' 4"  (162.6 cm) Weight: 130 lb (59 kg) IBW/kg (Calculated) : 54.7  Temp (24hrs), Avg:98.2 F (36.8 C), Min:98.2 F (36.8 C), Max:98.2 F (36.8 C)   Recent Labs Lab 11/25/15 1324 11/25/15 1420  WBC 28.8*  --   CREATININE 0.97  --   LATICACIDVEN  --  2.2*    Estimated Creatinine Clearance: 34.6 mL/min (by C-G formula based on SCr of 0.97 mg/dL).    Allergies  Allergen Reactions  . Sulfa Antibiotics Hives    Antimicrobials this admission: 10/29 cefepime x 1 10/29 vanc >>   Dose adjustments this admission:   Microbiology results: 10/29 BCx: sent  UCx:    Sputum:   10/29 MRSA PCR: sent 10/29 Cdiff: negative  Thank you for allowing pharmacy to be a part of this patient's care.  Horris LatinoHolly Gilliam, PharmD/MLS Pharmacy Resident 11/25/2015 4:51 PM

## 2015-11-25 NOTE — Progress Notes (Signed)
Notified md of low b/p, orders taken

## 2015-11-25 NOTE — Progress Notes (Signed)
Admission: Patient in 143, responding to pain but not verbal demands. Patient disoriented X4. Bed alarm activated.Patient is moderately contracted and favoring her right side. RN placed pillows to her side and between her knees. Patient has diabetic ulcers on anterior right foot and right heel. RN placed pink foam dressings on them. Patient BP unstable, most recent one is 129/111. Mayb be elevated from patient being transferred to unit bed and rolling from side to side. Family at bedside for supervision.   Harvie HeckMelanie Tanessa Tidd, RN

## 2015-11-25 NOTE — ED Provider Notes (Addendum)
[facility] Emergency Department Provider Note    None    (approximate)  I have reviewed the triage vital signs and the nursing notes.   HISTORY  Chief Complaint No chief complaint on file.  Level Caveat:  AMS, severe sepsis  HPI Kelli Valenzuela is a 82 y.o. female arrives to from [facility] with a reported complaint of weakness and 2 days of diarrhea associated with fever and elevated blood glucose. Patient I am able to provide any history. Patient arrives to the ER hypotensive and tachycardic. Has dark tar and melanotic stools however patient does reportedly take iron supplementation. Presentation, She does have a significant history of cardiac disease and is on Plavix.   Past Medical History:  Diagnosis Date  . Coronary artery disease    s/p MI  . Dementia   . Depression   . Diabetes mellitus without complication (HCC)    secondary to gallstone pancreatitis and subsequent pancreatitc destruction  . Hyperlipidemia     Patient Active Problem List   Diagnosis Date Noted  . Pressure injury of skin 10/23/2015  . Hip fracture (HCC) 09/11/2015  . Hypoglycemia 09/08/2015    Past Surgical History:  Procedure Laterality Date  . EXPLORATORY LAPAROTOMY  1990   multiple  . INTRAMEDULLARY (IM) NAIL INTERTROCHANTERIC Left 09/12/2015   Procedure: INTRAMEDULLARY (IM) NAIL INTERTROCHANTRIC;  Surgeon: Juanell Fairly, MD;  Location: [facility] ORS;  Service: Orthopedics;  Laterality: Left;  . PERCUTANEOUS CORONARY STENT INTERVENTION (PCI-S)     LAD    Prior to Admission medications   Medication Sig Start Date End Date Taking? Authorizing Provider  acetaminophen (TYLENOL) 500 MG tablet Take 500 mg by mouth 3 (three) times daily.   Yes Historical Provider, MD  carvedilol (COREG) 6.25 MG tablet Take 6.25 mg by mouth 2 (two) times daily with a meal.   Yes Historical Provider, MD  clopidogrel (PLAVIX) 75 MG tablet Take 75 mg by mouth daily.   Yes Historical Provider,  MD  docusate sodium (COLACE) 100 MG capsule Take 100 mg by mouth 2 (two) times daily.   Yes Historical Provider, MD  ferrous sulfate 325 (65 FE) MG tablet Take 1 tablet (325 mg total) by mouth 3 (three) times daily after meals. 09/14/15  Yes Enedina Finner, MD  insulin aspart (NOVOLOG) 100 UNIT/ML injection Inject 4 Units into the skin 2 (two) times daily.   Yes Historical Provider, MD  insulin glargine (LANTUS) 100 UNIT/ML injection Inject 0.15 mLs (15 Units total) into the skin at bedtime. Patient taking differently: Inject 13 Units into the skin at bedtime.  10/23/15  Yes Shaune Pollack, MD  lisinopril (PRINIVIL,ZESTRIL) 5 MG tablet Take 5 mg by mouth daily.   Yes Historical Provider, MD  meloxicam (MOBIC) 7.5 MG tablet Take 7.5 mg by mouth daily.   Yes Historical Provider, MD  metFORMIN (GLUCOPHAGE) 500 MG tablet Take 1,000 mg by mouth 2 (two) times daily with a meal.    Yes Historical Provider, MD  senna (SENOKOT) 8.6 MG tablet Take 1 tablet by mouth 3 (three) times a week. Take on Monday, Wednesday, Saturday   Yes Historical Provider, MD  sertraline (ZOLOFT) 50 MG tablet Take 50 mg by mouth daily at 8 pm.   Yes Historical Provider, MD  Vitamin D, Ergocalciferol, (DRISDOL) 50000 units CAPS capsule Take 50,000 Units by mouth every 30 (thirty) days.   Yes Historical Provider, MD    Allergies Sulfa antibiotics  Family History  Problem Relation Age of Onset  .  Ovarian cancer Mother   . Esophageal varices Father     Social History Social History  Substance Use Topics  . Smoking status: Never Smoker  . Smokeless tobacco: Never Used  . Alcohol use No    Review of Systems Patient denies headaches, rhinorrhea, blurry vision, numbness, shortness of breath, chest pain, edema, cough, abdominal pain, nausea, vomiting, diarrhea, dysuria, fevers, rashes or hallucinations unless otherwise stated above in HPI. ____________________________________________   PHYSICAL EXAM:  VITAL SIGNS: Vitals:    11/25/15 1600 11/25/15 1615  BP: (!) 84/49 (!) 72/46  Pulse: 92 88  Resp: 17 (!) 22  Temp:      Constitutional: Somnolent and unresponsive. Will moan to painful stimuli. Appears acutely ill Eyes: Conjunctivae are normal. PERRL Head: Atraumatic. Nose: No congestion/rhinnorhea. Mouth/Throat: Mucous membranes are . Emesis around the mouth Oropharynx non-erythematous. Neck: No stridor. Painless ROM. No cervical spine tenderness to palpation Hematological/Lymphatic/Immunilogical: No cervical lymphadenopathy. Cardiovascular: Tachycardic rate, regular rhythm. Grossly normal heart sounds.  Cap refill 3 seconds Respiratory: Normal respiratory effort.  No retractions. Coarse rhonchorous breath sounds in right base Gastrointestinal: Soft and nontender. No distention. No abdominal bruits. No CVA tenderness. Grossly melanotic stool with guaiac positive stool Musculoskeletal: No lower extremity tenderness nor edema.  No joint effusions. Neurologic: Drowsy and lethargic appearing,  head turned to the right Skin:  Skin is warm, dry and intact. Garrett ecchymosis throughout  ____________________________________________   LABS (all labs ordered are listed, but only abnormal results are displayed)  Results for orders placed or performed during the hospital encounter of 11/25/15 (from the past 24 hour(s))  Comprehensive metabolic panel     Status: Abnormal   Collection Time: 11/25/15  1:24 PM  Result Value Ref Range   Sodium 137 135 - 145 mmol/L   Potassium 3.5 3.5 - 5.1 mmol/L   Chloride 102 101 - 111 mmol/L   CO2 23 22 - 32 mmol/L   Glucose, Bld 243 (H) 65 - 99 mg/dL   BUN 42 (H) 6 - 20 mg/dL   Creatinine, Ser 1.61 0.44 - 1.00 mg/dL   Calcium 8.5 (L) 8.9 - 10.3 mg/dL   Total Protein 6.4 (L) 6.5 - 8.1 g/dL   Albumin 2.6 (L) 3.5 - 5.0 g/dL   AST 34 15 - 41 U/L   ALT 21 14 - 54 U/L   Alkaline Phosphatase 118 38 - 126 U/L   Total Bilirubin 0.7 0.3 - 1.2 mg/dL   GFR calc non Af Amer 51 (L) >60  mL/min   GFR calc Af Amer 59 (L) >60 mL/min   Anion gap 12 5 - 15  CBC WITH DIFFERENTIAL     Status: Abnormal   Collection Time: 11/25/15  1:24 PM  Result Value Ref Range   WBC 28.8 (H) 3.6 - 11.0 K/uL   RBC 3.42 (L) 3.80 - 5.20 MIL/uL   Hemoglobin 10.1 (L) 12.0 - 16.0 g/dL   HCT 09.6 (L) 04.5 - 40.9 %   MCV 86.3 80.0 - 100.0 fL   MCH 29.4 26.0 - 34.0 pg   MCHC 34.1 32.0 - 36.0 g/dL   RDW 81.1 (H) 91.4 - 78.2 %   Platelets 373 150 - 440 K/uL   Neutrophils Relative % 92 %   Neutro Abs 26.4 (H) 1.4 - 6.5 K/uL   Lymphocytes Relative 4 %   Lymphs Abs 1.2 1.0 - 3.6 K/uL   Monocytes Relative 4 %   Monocytes Absolute 1.1 (H) 0.2 - 0.9 K/uL   Eosinophils  Relative 0 %   Eosinophils Absolute 0.0 0 - 0.7 K/uL   Basophils Relative 0 %   Basophils Absolute 0.1 0 - 0.1 K/uL  Protime-INR     Status: None   Collection Time: 11/25/15  1:24 PM  Result Value Ref Range   Prothrombin Time 14.3 11.4 - 15.2 seconds   INR 1.11   APTT     Status: None   Collection Time: 11/25/15  1:24 PM  Result Value Ref Range   aPTT 35 24 - 36 seconds  Troponin I     Status: Abnormal   Collection Time: 11/25/15  1:24 PM  Result Value Ref Range   Troponin I 0.25 (HH) <0.03 ng/mL  Urinalysis complete, with microscopic ([facility] only)     Status: Abnormal   Collection Time: 11/25/15  2:19 PM  Result Value Ref Range   Color, Urine YELLOW (A) YELLOW   APPearance HAZY (A) CLEAR   Glucose, UA 150 (A) NEGATIVE mg/dL   Bilirubin Urine NEGATIVE NEGATIVE   Ketones, ur 1+ (A) NEGATIVE mg/dL   Specific Gravity, Urine 1.018 1.005 - 1.030   Hgb urine dipstick NEGATIVE NEGATIVE   pH 5.0 5.0 - 8.0   Protein, ur 30 (A) NEGATIVE mg/dL   Nitrite NEGATIVE NEGATIVE   Leukocytes, UA 2+ (A) NEGATIVE   RBC / HPF 0-5 0 - 5 RBC/hpf   WBC, UA 6-30 0 - 5 WBC/hpf   Bacteria, UA RARE (A) NONE SEEN   Squamous Epithelial / LPF NONE SEEN NONE SEEN   WBC Clumps PRESENT    Budding Yeast PRESENT   C difficile quick scan w PCR reflex      Status: None   Collection Time: 11/25/15  2:19 PM  Result Value Ref Range   C Diff antigen NEGATIVE NEGATIVE   C Diff toxin NEGATIVE NEGATIVE   C Diff interpretation No C. difficile detected.   Type and screen [facility]     Status: None (Preliminary result)   Collection Time: 11/25/15  2:19 PM  Result Value Ref Range   ABO/RH(D) B POS    Antibody Screen NEG    Sample Expiration 11/28/2015    Unit Number [id number]    Blood Component Type RED CELLS,LR    Unit division 00    Status of Unit ALLOCATED    Transfusion Status OK TO TRANSFUSE    Crossmatch Result Compatible   Prepare RBC     Status: None   Collection Time: 11/25/15  2:19 PM  Result Value Ref Range   Order Confirmation ORDER PROCESSED BY BLOOD BANK   Lactic acid, plasma     Status: Abnormal   Collection Time: 11/25/15  2:20 PM  Result Value Ref Range   Lactic Acid, Venous 2.2 (HH) 0.5 - 1.9 mmol/L   ____________________________________________  EKG My review and personal interpretation at Time: 14:32   Indication: hypotension  Rate: 80  Rhythm: nsr Axis: normal Other: non specific st changes, no acute ischemia ____________________________________________  RADIOLOGY  I personally reviewed all radiographic images ordered to evaluate for the above acute complaints and reviewed radiology reports and findings.  These findings were personally discussed with the patient.  Please see medical record for radiology report.  ____________________________________________   PROCEDURES  Procedure(s) performed: none    Critical Care performed:yes CRITICAL CARE Performed by: Willy EddyPatrick Amyjo Mizrachi   Total critical care time: 45 minutes  Critical care time was exclusive of separately billable procedures and treating other patients.  Critical care  was necessary to treat or prevent imminent or life-threatening deterioration.  Critical care was time spent personally by me on the following activities:  development of treatment plan with patient and/or surrogate as well as nursing, discussions with consultants, evaluation of patient's response to treatment, examination of patient, obtaining history from patient or surrogate, ordering and performing treatments and interventions, ordering and review of laboratory studies, ordering and review of radiographic studies, pulse oximetry and re-evaluation of patient's condition.  ____________________________________________   INITIAL IMPRESSION / ASSESSMENT AND PLAN / ED COURSE  Pertinent labs & imaging results that were available during my care of the patient were reviewed by me and considered in my medical decision making (see chart for details).  DDX: sepsis, gi bleed, colitis, pna, cva  Kelli Hatchetnna Valenzuela is a 82 y.o. who presents to the ED with altered mental status, hypotension fevers at nursing facility and diarrhea. Patient arrives afebrile but tachycardic cardiac and kidney ill appearing. Patient is with daughter at bedside who states the patient is DO NOT INTUBATE and DO NOT RESUSCITATE but is agreeable to receiving IV fluids, antibiotics and pressors if needed. Patient with severe underlying dementia limiting history but based on exam I am concerned for upper GI bleed based on hypotension and profuse melanotic stools.  The patient will be placed on continuous pulse oximetry and telemetry for monitoring.  Laboratory evaluation will be sent to evaluate for the above complaints.     Clinical Course  Comment By Time  Patient mildly improving mentation after IV fluid bolus. Blood work with elevated BUN is further suggests acute upper GI bleed. No reported history of cirrhosis. Patient has been ordered Protonix bolus. At this point prognosis remains very poor. Patient and family member updated at bedside. Willy EddyPatrick Wilhemenia Camba, MD 10/29 1412  Blood pressure continues improving with IV fluids. Duration of GI bleed uncertain given several days of diarrhea and  progressively worsening weakness however her hemoglobin is stable. Given her marked leukocytosis presentation also, located as she may have underlying Willy EddyPatrick Michaell Grider, MD 10/29 1442  Had extensive conversation with the patient's son and daughter at bedside regarding the patient's illness and diagnostic tests performed thus far. She is continuing to respond IV fluids. After discussion of risks and benefits of placement of central line with a temperature use of IV vasopressors they have elected for IV antibiotics, blood products and IV fluids only. Their primary focus is comfort care at this time.  I spoke with Dr. Judithann SheenSparks who agrees to admit patient for further evaluation and management.   Willy EddyPatrick Jacquelyn Shadrick, MD 10/29 1641     ____________________________________________   FINAL CLINICAL IMPRESSION(S) / ED DIAGNOSES  Final diagnoses:  Sepsis, due to unspecified organism (HCC)  Melena  Leukocytosis, unspecified type  Elevated troponin I level      NEW MEDICATIONS STARTED DURING THIS VISIT:  New Prescriptions   No medications on file     Note:  This document was prepared using Dragon voice recognition software and may include unintentional dictation errors.    Willy EddyPatrick Aristotle Lieb, MD 11/25/15 1643    Willy EddyPatrick Nechuma Boven, MD 11/25/15 [phone]

## 2015-11-25 NOTE — H&P (Signed)
[facility] [place] [facility]   PATIENT NAME: Kelli Valenzuela    MR#:  [id number]  DATE OF BIRTH:  04/02/1925  DATE OF ADMISSION:  11/25/2015  PRIMARY CARE PHYSICIAN: [facility]  REQUESTING/REFERRING PHYSICIAN: Dr Willy EddyPatrick Robinson  CHIEF COMPLAINT:  Sent in by facility for diarrhea and high sugars.  HISTORY OF PRESENT ILLNESS:  Kelli Valenzuela  is a 82 y.o. female sent in by facility for diarrhea and high sugars. Patient unable to give any history. Patient does have a history of dementia but is normally able to communicate. Family noticed that she's been going downhill since her husband died and now is in a memory care unit is mostly wheelchair bound. This a.m. she was real lethargic. She's had diarrhea for 2 days. In the ER she was found to have melena which was guaiac positive. Also she was found to have a very elevated white blood cell count and antibiotics were started. ER physician ordered a unit of blood to be given. She was also found to be hypotensive.  PAST MEDICAL HISTORY:   Past Medical History:  Diagnosis Date  . Coronary artery disease    s/p MI  . Dementia   . Depression   . Diabetes mellitus without complication (HCC)    secondary to gallstone pancreatitis and subsequent pancreatitc destruction  . Hyperlipidemia   . Peripheral vascular disease (HCC)     PAST SURGICAL HISTORY:   Past Surgical History:  Procedure Laterality Date  . EXPLORATORY LAPAROTOMY  1990   multiple  . INTRAMEDULLARY (IM) NAIL INTERTROCHANTERIC Left 09/12/2015   Procedure: INTRAMEDULLARY (IM) NAIL INTERTROCHANTRIC;  Surgeon: Juanell FairlyKevin Krasinski, MD;  Location: [facility] ORS;  Service: Orthopedics;  Laterality: Left;  . PERCUTANEOUS CORONARY STENT INTERVENTION (PCI-S)     LAD    SOCIAL HISTORY:   Social History  Substance Use Topics  . Smoking status: Never Smoker  . Smokeless tobacco: Never Used  . Alcohol use No    FAMILY HISTORY:   Family  History  Problem Relation Age of Onset  . Colon cancer Mother   . Esophageal varices Father     DRUG ALLERGIES:   Allergies  Allergen Reactions  . Sulfa Antibiotics Hives    REVIEW OF SYSTEMS:  Unable to provide review of systems secondary to dementia and critical illness  MEDICATIONS AT HOME:   Prior to Admission medications   Medication Sig Start Date End Date Taking? Authorizing Provider  acetaminophen (TYLENOL) 500 MG tablet Take 500 mg by mouth 3 (three) times daily.   Yes Historical Provider, MD  carvedilol (COREG) 6.25 MG tablet Take 6.25 mg by mouth 2 (two) times daily with a meal.   Yes Historical Provider, MD  clopidogrel (PLAVIX) 75 MG tablet Take 75 mg by mouth daily.   Yes Historical Provider, MD  docusate sodium (COLACE) 100 MG capsule Take 100 mg by mouth 2 (two) times daily.   Yes Historical Provider, MD  ferrous sulfate 325 (65 FE) MG tablet Take 1 tablet (325 mg total) by mouth 3 (three) times daily after meals. 09/14/15  Yes Enedina FinnerSona Patel, MD  insulin aspart (NOVOLOG) 100 UNIT/ML injection Inject 4 Units into the skin 2 (two) times daily.   Yes Historical Provider, MD  insulin glargine (LANTUS) 100 UNIT/ML injection Inject 0.15 mLs (15 Units total) into the skin at bedtime. Patient taking differently: Inject 13 Units into the skin at bedtime.  10/23/15  Yes Shaune PollackQing Chen, MD  lisinopril (PRINIVIL,ZESTRIL) 5 MG tablet Take  5 mg by mouth daily.   Yes Historical Provider, MD  meloxicam (MOBIC) 7.5 MG tablet Take 7.5 mg by mouth daily.   Yes Historical Provider, MD  metFORMIN (GLUCOPHAGE) 500 MG tablet Take 1,000 mg by mouth 2 (two) times daily with a meal.    Yes Historical Provider, MD  senna (SENOKOT) 8.6 MG tablet Take 1 tablet by mouth 3 (three) times a week. Take on Monday, Wednesday, Saturday   Yes Historical Provider, MD  sertraline (ZOLOFT) 50 MG tablet Take 50 mg by mouth daily at 8 pm.   Yes Historical Provider, MD  Vitamin D, Ergocalciferol, (DRISDOL) 50000 units  CAPS capsule Take 50,000 Units by mouth every 30 (thirty) days.   Yes Historical Provider, MD      VITAL SIGNS:  Blood pressure (!) 90/50, pulse 87, temperature 98.2 F (36.8 C), temperature source Oral, resp. rate (!) 25, height 5\' 4"  (1.626 m), weight 59 kg (130 lb), SpO2 100 %.  PHYSICAL EXAMINATION:  GENERAL:  82 y.o.-year-old patient lying in the bed with no acute distress.  EYES: Pupils equal, round, reactive to light and accommodation. No scleral icterus.   HEENT: Head atraumatic, normocephalic. Unable to look in the mouth.  NECK:  Supple, no jugular venous distention. No thyroid enlargement, no tenderness.  LUNGS: Normal breath sounds bilaterally, no wheezing, rales,rhonchi or crepitation. No use of accessory muscles of respiration.  CARDIOVASCULAR: S1, S2 normal. No murmurs, rubs, or gallops.  ABDOMEN: Soft, nontender, nondistended. Bowel sounds present. No organomegaly or mass.  EXTREMITIES:Trace edema. No cyanosis, or clubbing.  NEUROLOGIC: The patient is lethargic  PSYCHIATRIC: The patient is Lethargic.  SKIN: Bruising around the right eye. Scab seen on right shin medial side. Left heel necrotic decubiti the size of a silver dollar. Top of the left foot also a ulceration.  LABORATORY PANEL:   CBC  Recent Labs Lab 11/25/15 1324  WBC 28.8*  HGB 10.1*  HCT 29.5*  PLT 373   ------------------------------------------------------------------------------------------------------------------  Chemistries   Recent Labs Lab 11/25/15 1324  NA 137  K 3.5  CL 102  CO2 23  GLUCOSE 243*  BUN 42*  CREATININE 0.97  CALCIUM 8.5*  AST 34  ALT 21  ALKPHOS 118  BILITOT 0.7   ------------------------------------------------------------------------------------------------------------------  Cardiac Enzymes  Recent Labs Lab 11/25/15 1324  TROPONINI 0.25*    ------------------------------------------------------------------------------------------------------------------  RADIOLOGY:  Dg Chest Port 1 View  Result Date: 11/25/2015 CLINICAL DATA:  Weakness, diarrhea. EXAM: PORTABLE CHEST 1 VIEW COMPARISON:  Radiograph of October 22, 2015. FINDINGS: The heart size and mediastinal contours are within normal limits. Both lungs are clear. Atherosclerosis of thoracic aorta is noted. Status post coronary bypass graft. Status post left shoulder arthroplasty. No pneumothorax or pleural effusion is noted. IMPRESSION: No acute cardiopulmonary abnormality seen.  Aortic atherosclerosis. Electronically Signed   By: Lupita Raider, M.D.   On: 11/25/2015 14:19    EKG:   Sinus rhythm 83 bpm, intraventricular conduction delay, right bundle-branch block.  IMPRESSION AND PLAN:   1. Shock which is a combination of septic shock and hypovolemic shock. Family declined pressors at this time. They did agree to blood transfusion and fluids. Patient is a DO NOT RESUSCITATE. If patient doesn't respond to blood and IV fluids they would like to make her comfortable. Hold antihypertensive medications. 2. Septic shock. Follow-up cultures. Left heel has a necrotic area on it which could be source of infection. Chest x-ray negative at this time. We'll get a podiatry consultation for the heel.  Obtain an x-ray of the left heel. Continue aggressive antibiotics with vancomycin and Maxipime. 3. Hypovolemic shock from upper GI bleed. Hold the Plavix and Mobitz at this time. Protonix drip. Family deferred any endoscopy at this time. Medical management at this point. Serial hemoglobins and transfuse as needed. ER physician ordered 1 unit of blood. 4. Type 2 diabetes mellitus. Sliding scale only while nothing by mouth 5. Acute encephalopathy likely secondary to sepsis 6. Dementia. Continue Zoloft. Potential Seroquel at night to sleep. 7. History coronary artery disease 8. History of  peripheral vascular disease  All the records are reviewed and case discussed with ED provider. Management plans discussed with the patient, family and they are in agreement.  CODE STATUS: DNR  TOTAL TIME TAKING CARE OF THIS PATIENT: 55 minutes.    Alford HighlandWIETING, Chavez Rosol M.D on 11/25/2015 at 5:36 PM  Between 7am to 6pm - Pager - [phone]  After 6pm call admission pager [phone]  [facility] Office  [phone]  CC: Primary care physician; [facility]

## 2015-11-25 NOTE — ED Triage Notes (Addendum)
Daughter brings in pt from [facility]  With c/o weakness, diarrhea for 2 days. Fever and high blood glucose . CBG 248 in triage. Pt resides in the memory care clinic. Pt alert to self

## 2015-11-26 LAB — BLOOD CULTURE ID PANEL (REFLEXED)
Acinetobacter baumannii: NOT DETECTED
CANDIDA GLABRATA: NOT DETECTED
Candida albicans: NOT DETECTED
Candida krusei: NOT DETECTED
Candida parapsilosis: NOT DETECTED
Candida tropicalis: NOT DETECTED
ENTEROBACTER CLOACAE COMPLEX: NOT DETECTED
ENTEROBACTERIACEAE SPECIES: NOT DETECTED
ENTEROCOCCUS SPECIES: NOT DETECTED
Escherichia coli: NOT DETECTED
HAEMOPHILUS INFLUENZAE: NOT DETECTED
Klebsiella oxytoca: NOT DETECTED
Klebsiella pneumoniae: NOT DETECTED
LISTERIA MONOCYTOGENES: NOT DETECTED
METHICILLIN RESISTANCE: DETECTED — AB
NEISSERIA MENINGITIDIS: NOT DETECTED
PROTEUS SPECIES: NOT DETECTED
Pseudomonas aeruginosa: NOT DETECTED
STAPHYLOCOCCUS SPECIES: DETECTED — AB
STREPTOCOCCUS AGALACTIAE: NOT DETECTED
STREPTOCOCCUS SPECIES: NOT DETECTED
Serratia marcescens: NOT DETECTED
Staphylococcus aureus (BCID): DETECTED — AB
Streptococcus pneumoniae: NOT DETECTED
Streptococcus pyogenes: NOT DETECTED

## 2015-11-26 LAB — CBC
HCT: 29.7 % — ABNORMAL LOW (ref 35.0–47.0)
Hemoglobin: 9.6 g/dL — ABNORMAL LOW (ref 12.0–16.0)
MCH: 28.6 pg (ref 26.0–34.0)
MCHC: 32.4 g/dL (ref 32.0–36.0)
MCV: 88.1 fL (ref 80.0–100.0)
PLATELETS: 298 10*3/uL (ref 150–440)
RBC: 3.38 MIL/uL — AB (ref 3.80–5.20)
RDW: 16.8 % — ABNORMAL HIGH (ref 11.5–14.5)
WBC: 23.3 10*3/uL — AB (ref 3.6–11.0)

## 2015-11-26 LAB — GASTROINTESTINAL PANEL BY PCR, STOOL (REPLACES STOOL CULTURE)
ADENOVIRUS F40/41: NOT DETECTED
ASTROVIRUS: NOT DETECTED
CYCLOSPORA CAYETANENSIS: NOT DETECTED
Campylobacter species: NOT DETECTED
Cryptosporidium: NOT DETECTED
ENTAMOEBA HISTOLYTICA: NOT DETECTED
ENTEROAGGREGATIVE E COLI (EAEC): NOT DETECTED
ENTEROPATHOGENIC E COLI (EPEC): NOT DETECTED
ENTEROTOXIGENIC E COLI (ETEC): NOT DETECTED
GIARDIA LAMBLIA: NOT DETECTED
NOROVIRUS GI/GII: NOT DETECTED
Plesimonas shigelloides: NOT DETECTED
Rotavirus A: NOT DETECTED
SAPOVIRUS (I, II, IV, AND V): NOT DETECTED
Salmonella species: NOT DETECTED
Shiga like toxin producing E coli (STEC): NOT DETECTED
Shigella/Enteroinvasive E coli (EIEC): NOT DETECTED
VIBRIO CHOLERAE: NOT DETECTED
VIBRIO SPECIES: NOT DETECTED
Yersinia enterocolitica: NOT DETECTED

## 2015-11-26 LAB — GLUCOSE, CAPILLARY
GLUCOSE-CAPILLARY: 403 mg/dL — AB (ref 65–99)
Glucose-Capillary: 308 mg/dL — ABNORMAL HIGH (ref 65–99)
Glucose-Capillary: 214 mg/dL — ABNORMAL HIGH (ref 65–99)

## 2015-11-26 LAB — TYPE AND SCREEN
ABO/RH(D): B POS
Antibody Screen: NEGATIVE
Unit division: 0

## 2015-11-26 LAB — BASIC METABOLIC PANEL
Anion gap: 11 (ref 5–15)
BUN: 31 mg/dL — AB (ref 6–20)
CALCIUM: 7.7 mg/dL — AB (ref 8.9–10.3)
CHLORIDE: 113 mmol/L — AB (ref 101–111)
CO2: 16 mmol/L — AB (ref 22–32)
CREATININE: 0.72 mg/dL (ref 0.44–1.00)
GFR calc non Af Amer: >60 mL/min (ref >60)
Glucose, Bld: 314 mg/dL — ABNORMAL HIGH (ref 65–99)
Potassium: 3.6 mmol/L (ref 3.5–5.1)
Sodium: 140 mmol/L (ref 135–145)

## 2015-11-26 MED ORDER — MORPHINE SULFATE (PF) 2 MG/ML IV SOLN: 0.5000 mg | INTRAVENOUS | Status: DC | PRN

## 2015-11-26 MED ORDER — INSULIN GLARGINE 100 UNIT/ML ~~LOC~~ SOLN
8.0000 [IU] | Freq: Every day | SUBCUTANEOUS | Status: DC
  Administered 2015-11-26: 8 [IU] via SUBCUTANEOUS
  Filled 2015-11-26 (×2): qty 0.08

## 2015-11-26 MED ORDER — PANTOPRAZOLE SODIUM 40 MG IV SOLR
40.0000 mg | INTRAVENOUS | Status: DC
  Administered 2015-11-26: 40 mg via INTRAVENOUS
  Filled 2015-11-26: qty 40

## 2015-11-26 MED ORDER — ORAL CARE MOUTH RINSE
15.0000 mL | Freq: Two times a day (BID) | OROMUCOSAL | Status: DC
  Administered 2015-11-26 – 2015-11-27 (×3): 15 mL via OROMUCOSAL

## 2015-11-26 MED ORDER — INSULIN ASPART 100 UNIT/ML ~~LOC~~ SOLN
0.0000 [IU] | Freq: Three times a day (TID) | SUBCUTANEOUS | Status: DC
  Administered 2015-11-26: 7 [IU] via SUBCUTANEOUS
  Administered 2015-11-26: 9 [IU] via SUBCUTANEOUS
  Filled 2015-11-26: qty 9
  Filled 2015-11-26: qty 7

## 2015-11-26 NOTE — Progress Notes (Signed)
[facility] [place] [facility]   PATIENT NAME: Kelli Valenzuela    MR#:  [id number]  DATE OF BIRTH:  12/10/1925  SUBJECTIVE:  Patient lethargic and unresponsive. Family in the room gives history Brought in from facility with increasing confusion. She is mostly wheelchair bound. Quite lethargic. Diarrhea for 2 days.  REVIEW OF SYSTEMS:   Review of Systems  Unable to perform ROS: Dementia   Tolerating Diet: Tolerating PT:   DRUG ALLERGIES:   Allergies  Allergen Reactions  . Sulfa Antibiotics Hives    VITALS:  Blood pressure (!) 68/53, pulse 79, temperature 98.8 F (37.1 C), temperature source Axillary, resp. rate 19, height 5\' 4"  (1.626 m), weight 59 kg (130 lb), SpO2 97 %.  PHYSICAL EXAMINATION:   Physical Exam  GENERAL:  82 y.o.-year-old patient lying in the bed with no acute distress. Appears critically ill EYES: Pupils equal, round, reactive to light and accommodation. No scleral icterus. Extraocular muscles intact.  HEENT: Head atraumatic, normocephalic. Oropharynx and nasopharynx clear.  NECK:  Supple, no jugular venous distention. No thyroid enlargement, no tenderness.  LUNGS: Normal breath sounds bilaterally, no wheezing, rales, rhonchi. No use of accessory muscles of respiration.  CARDIOVASCULAR: S1, S2 normal. No murmurs, rubs, or gallops.  ABDOMEN: Soft, nontender, nondistended. Bowel sounds present. No organomegaly or mass.  EXTREMITIES: Large necrotic ulcer on the left heel present appears chronic pressure sore NEUROLOGIC: Unable to assess since patient does not communicate   PSYCHIATRIC:  Resting quietly does not communicate SKIN: Bruises present in the lower extremity   LABORATORY PANEL:  CBC  Recent Labs Lab 11/26/15 0425  WBC 23.3*  HGB 9.6*  HCT 29.7*  PLT 298    Chemistries   Recent Labs Lab 11/25/15 1324 11/26/15 0425  NA 137 140  K 3.5 3.6  CL 102 113*  CO2 23 16*  GLUCOSE 243* 314*  BUN 42* 31*   CREATININE 0.97 0.72  CALCIUM 8.5* 7.7*  AST 34  --   ALT 21  --   ALKPHOS 118  --   BILITOT 0.7  --    Cardiac Enzymes  Recent Labs Lab 11/25/15 1324  TROPONINI 0.25*   RADIOLOGY:  Dg Chest Port 1 View  Result Date: 11/25/2015 CLINICAL DATA:  Weakness, diarrhea. EXAM: PORTABLE CHEST 1 VIEW COMPARISON:  Radiograph of October 22, 2015. FINDINGS: The heart size and mediastinal contours are within normal limits. Both lungs are clear. Atherosclerosis of thoracic aorta is noted. Status post coronary bypass graft. Status post left shoulder arthroplasty. No pneumothorax or pleural effusion is noted. IMPRESSION: No acute cardiopulmonary abnormality seen.  Aortic atherosclerosis. Electronically Signed   By: Lupita RaiderJames  Green Jr, M.D.   On: 11/25/2015 14:19   Dg Foot Complete Left  Result Date: 11/25/2015 CLINICAL DATA:  Laceration on top of the foot are around the second and third digits. EXAM: LEFT FOOT - COMPLETE 3+ VIEW COMPARISON:  Pelvis and left hip 09/11/2015 FINDINGS: The bones are diffusely osteopenic. Atherosclerotic vascular calcifications noted. There is soft tissue swelling over the dorsum of the left at the level of the metatarsals. No soft tissue gas or foreign body is identified. IMPRESSION: Diffuse osteopenia.  No acute fracture identified. Soft tissue swelling over the dorsum of the foot. No radiopaque foreign body or soft tissue gas. Electronically Signed   By: Britta MccreedySusan  Turner M.D.   On: 11/25/2015 18:30   ASSESSMENT AND PLAN:   Kelli Hatchetnna Selk  is a 82 y.o. female sent in by  facility for diarrhea and high sugars. Patient unable to give any history. Patient does have a history of dementia but is normally able to communicate. Family noticed that she's been going downhill since her husband died and now is in a memory care unit is mostly wheelchair bound. This a.m. she was real lethargic. She's had diarrhea for 2 days  1. Shock which is a combination of septic shock and hypovolemic  shock.  -Family declined pressors at this time. They did agree to blood transfusion and fluids. Patient is a DO NOT RESUSCITATE. If patient doesn't respond to blood and IV fluids they would like to make her comfortable. Hold antihypertensive medications.  2. Septic shock. Secondary to MRSA septicemia  - Left heel has a necrotic area on it which could be source of infection. Chest x-ray negative at this time. -Continue aggressive antibiotics with vancomycin and Maxipime.  3. Hypovolemic shock from upper GI bleed. Hold the Plavix and Mobitz at this time. Protonix drip. Family deferred any endoscopy at this time. Medical management at this point. Serial hemoglobins and transfuse as needed. ER physician ordered 1 unit of blood. -Hemoglobin is stable at 9.6. Continue IV fluids. -IV Protonix  4. Type 2 diabetes mellitus. Sliding scale only while nothing by mouth -Sugars are in the 400s OF basal insulin Lantus and sliding scale  5. Acute encephalopathy likely secondary to sepsis  6. Dementia. Continue Zoloft. Potential Seroquel at night to sleep.  7. History coronary artery disease  8. History of peripheral vascular disease Left message for Vira BlancoMarianne Baswell patient's daughter. Long-term prognosis poor Case discussed with Care Management/Social Worker. Management plans discussed with the patient, family and they are in agreement.  CODE STATUS: DO NOT RESUSCITATE  DVT Prophylaxis: SCD teds   TOTAL TIME TAKING CARE OF THIS PATIENT: 35 minutes.  >50% time spent on counselling and coordination of care  POSSIBLE D/C IN one to 2 DAYS, DEPENDING ON CLINICAL CONDITION.  Note: This dictation was prepared with Dragon dictation along with smaller phrase technology. Any transcriptional errors that result from this process are unintentional.  Kortland Nichols M.D on 11/26/2015 at 3:17 PM  Between 7am to 6pm - Pager - [phone]  After 6pm go to [email or web address] - password [email or web address]  [facility]  Office  [phone]  CC: Primary care physician; Pcp Not In System

## 2015-11-26 NOTE — Progress Notes (Signed)
PHARMACY - PHYSICIAN COMMUNICATION CRITICAL VALUE ALERT - BLOOD CULTURE IDENTIFICATION (BCID)  Results for orders placed or performed during the hospital encounter of 11/25/15  Blood Culture ID Panel (Reflexed) (Collected: 11/25/2015  2:20 PM)  Result Value Ref Range   Enterococcus species NOT DETECTED NOT DETECTED   Listeria monocytogenes NOT DETECTED NOT DETECTED   Staphylococcus species DETECTED (A) NOT DETECTED   Staphylococcus aureus DETECTED (A) NOT DETECTED   Methicillin resistance DETECTED (A) NOT DETECTED   Streptococcus species NOT DETECTED NOT DETECTED   Streptococcus agalactiae NOT DETECTED NOT DETECTED   Streptococcus pneumoniae NOT DETECTED NOT DETECTED   Streptococcus pyogenes NOT DETECTED NOT DETECTED   Acinetobacter baumannii NOT DETECTED NOT DETECTED   Enterobacteriaceae species NOT DETECTED NOT DETECTED   Enterobacter cloacae complex NOT DETECTED NOT DETECTED   Escherichia coli NOT DETECTED NOT DETECTED   Klebsiella oxytoca NOT DETECTED NOT DETECTED   Klebsiella pneumoniae NOT DETECTED NOT DETECTED   Proteus species NOT DETECTED NOT DETECTED   Serratia marcescens NOT DETECTED NOT DETECTED   Haemophilus influenzae NOT DETECTED NOT DETECTED   Neisseria meningitidis NOT DETECTED NOT DETECTED   Pseudomonas aeruginosa NOT DETECTED NOT DETECTED   Candida albicans NOT DETECTED NOT DETECTED   Candida glabrata NOT DETECTED NOT DETECTED   Candida krusei NOT DETECTED NOT DETECTED   Candida parapsilosis NOT DETECTED NOT DETECTED   Candida tropicalis NOT DETECTED NOT DETECTED    Name of physician (or Provider) Contacted: [facility]  Changes to prescribed antibiotics required: n/a  Jayland Null S 11/26/2015  5:19 AM

## 2015-11-26 NOTE — Consult Note (Signed)
[facility] Infectious Disease     Reason for Consult: MRSA bacteremia   Referring Physician: Nicholes Mango Date of Admission:  11/25/2015   Active Problems:   Sepsis [facility])   HPI: Kelli Valenzuela is a 82 y.o. female admitted with AMS, and diarrhea with melena.  On admit wbc markedly elevated 18 and temp 101. She has a l heel ulcer and bcx +MRSA.  Family friend at bedside.     Past Medical History:  Diagnosis Date  . Coronary artery disease    s/p MI  . Dementia   . Depression   . Diabetes mellitus without complication ([place])    secondary to gallstone pancreatitis and subsequent pancreatitc destruction  . Hyperlipidemia   . Peripheral vascular disease [facility])    Past Surgical History:  Procedure Laterality Date  . EXPLORATORY LAPAROTOMY  1990   multiple  . INTRAMEDULLARY (IM) NAIL INTERTROCHANTERIC Left 09/12/2015   Procedure: INTRAMEDULLARY (IM) NAIL INTERTROCHANTRIC;  Surgeon: Thornton Park, MD;  Location: [facility] ORS;  Service: Orthopedics;  Laterality: Left;  . PERCUTANEOUS CORONARY STENT INTERVENTION (PCI-S)     LAD   Social History  Substance Use Topics  . Smoking status: Never Smoker  . Smokeless tobacco: Never Used  . Alcohol use No   Family History  Problem Relation Age of Onset  . Colon cancer Mother   . Esophageal varices Father     Allergies:  Allergies  Allergen Reactions  . Sulfa Antibiotics Hives    Current antibiotics: Antibiotics Given (last 72 hours)    Date/Time Action Medication Dose Rate   11/26/15 0435 Given   vancomycin (VANCOCIN) IVPB 750 mg/150 ml premix 750 mg 150 mL/hr      MEDICATIONS: . ceFEPime (MAXIPIME) IV  1 g Intravenous STAT  . ceFEPime (MAXIPIME) IV  2 g Intravenous Q24H  . Chlorhexidine Gluconate Cloth  6 each Topical Q0600  . insulin aspart  0-9 Units Subcutaneous TID WC  . mouth rinse  15 mL Mouth Rinse BID  . mupirocin ointment  1 application Nasal BID  . pantoprazole (PROTONIX) IV  40 mg Intravenous Q24H  .  sertraline  50 mg Oral Q2000  . vancomycin  750 mg Intravenous Q24H    Review of Systems -unable to obtain   OBJECTIVE: Temp:  [98.1 F (36.7 C)-100.8 F (38.2 C)] 98.8 F (37.1 C) (10/30 1136) Pulse Rate:  [26-117] 79 (10/30 1136) Resp:  [14-28] 19 (10/30 1136) BP: (64-156)/(21-111) 68/53 (10/30 1136) SpO2:  [68 %-100 %] 97 % (10/30 1136) GENERAL:  82 y.o.-year-old patient lying in the bed  EYES: Pupils equal, round, reactive to light and accommodation. No scleral icterus.   HEENT: Head atraumatic, normocephalic. Unable to look in the mouth.  NECK:  Supple, no jugular venous distention. No thyroid enlargement, no tenderness.  LUNGS: Normal breath sounds bilaterally, no wheezing, rales,rhonchi or crepitation. No use of accessory muscles of respiration.  CARDIOVASCULAR: S1, S2 normal. No murmurs, rubs, or gallops.  ABDOMEN: Soft, nontender, nondistended. Bowel sounds present. No organomegaly or mass.  EXTREMITIES:Trace edema. No cyanosis, or clubbing .  NEUROLOGIC: The patient is lethargic  PSYCHIATRIC: The patient is Lethargic.  SKIN: Bruising around the right eye. Scab seen on right shin medial side. Left heel necrotic decubiti the size of a silver dollar. Top of the left foot also a dry ulceration.  LABS: Results for orders placed or performed during the hospital encounter of 11/25/15 (from the past 48 hour(s))  Comprehensive metabolic panel     Status:  Abnormal   Collection Time: 11/25/15  1:24 PM  Result Value Ref Range   Sodium 137 135 - 145 mmol/L   Potassium 3.5 3.5 - 5.1 mmol/L   Chloride 102 101 - 111 mmol/L   CO2 23 22 - 32 mmol/L   Glucose, Bld 243 (H) 65 - 99 mg/dL   BUN 42 (H) 6 - 20 mg/dL   Creatinine, Ser 0.97 0.44 - 1.00 mg/dL   Calcium 8.5 (L) 8.9 - 10.3 mg/dL   Total Protein 6.4 (L) 6.5 - 8.1 g/dL   Albumin 2.6 (L) 3.5 - 5.0 g/dL   AST 34 15 - 41 U/L   ALT 21 14 - 54 U/L   Alkaline Phosphatase 118 38 - 126 U/L   Total Bilirubin 0.7 0.3 - 1.2 mg/dL   GFR  calc non Af Amer 51 (L) >60 mL/min   GFR calc Af Amer 59 (L) >60 mL/min    Comment: (NOTE) The eGFR has been calculated using the CKD EPI equation. This calculation has not been validated in all clinical situations. eGFR's persistently <60 mL/min signify possible Chronic Kidney Disease.    Anion gap 12 5 - 15  CBC WITH DIFFERENTIAL     Status: Abnormal   Collection Time: 11/25/15  1:24 PM  Result Value Ref Range   WBC 28.8 (H) 3.6 - 11.0 K/uL   RBC 3.42 (L) 3.80 - 5.20 MIL/uL   Hemoglobin 10.1 (L) 12.0 - 16.0 g/dL   HCT 29.5 (L) 35.0 - 47.0 %   MCV 86.3 80.0 - 100.0 fL   MCH 29.4 26.0 - 34.0 pg   MCHC 34.1 32.0 - 36.0 g/dL   RDW 17.0 (H) 11.5 - 14.5 %   Platelets 373 150 - 440 K/uL   Neutrophils Relative % 92 %   Neutro Abs 26.4 (H) 1.4 - 6.5 K/uL   Lymphocytes Relative 4 %   Lymphs Abs 1.2 1.0 - 3.6 K/uL   Monocytes Relative 4 %   Monocytes Absolute 1.1 (H) 0.2 - 0.9 K/uL   Eosinophils Relative 0 %   Eosinophils Absolute 0.0 0 - 0.7 K/uL   Basophils Relative 0 %   Basophils Absolute 0.1 0 - 0.1 K/uL  Protime-INR     Status: None   Collection Time: 11/25/15  1:24 PM  Result Value Ref Range   Prothrombin Time 14.3 11.4 - 15.2 seconds   INR 1.11   APTT     Status: None   Collection Time: 11/25/15  1:24 PM  Result Value Ref Range   aPTT 35 24 - 36 seconds  Troponin I     Status: Abnormal   Collection Time: 11/25/15  1:24 PM  Result Value Ref Range   Troponin I 0.25 (HH) <0.03 ng/mL    Comment: CRITICAL RESULT CALLED TO, READ BACK BY AND VERIFIED WITH ALICIA GRANGER 48/27/07 @ 1604  MLK   Urinalysis complete, with microscopic ([facility] only)     Status: Abnormal   Collection Time: 11/25/15  2:19 PM  Result Value Ref Range   Color, Urine YELLOW (A) YELLOW   APPearance HAZY (A) CLEAR   Glucose, UA 150 (A) NEGATIVE mg/dL   Bilirubin Urine NEGATIVE NEGATIVE   Ketones, ur 1+ (A) NEGATIVE mg/dL   Specific Gravity, Urine 1.018 1.005 - 1.030   Hgb urine dipstick NEGATIVE  NEGATIVE   pH 5.0 5.0 - 8.0   Protein, ur 30 (A) NEGATIVE mg/dL   Nitrite NEGATIVE NEGATIVE   Leukocytes, UA 2+ (A) NEGATIVE  RBC / HPF 0-5 0 - 5 RBC/hpf   WBC, UA 6-30 0 - 5 WBC/hpf   Bacteria, UA RARE (A) NONE SEEN   Squamous Epithelial / LPF NONE SEEN NONE SEEN   WBC Clumps PRESENT    Budding Yeast PRESENT   C difficile quick scan w PCR reflex     Status: None   Collection Time: 11/25/15  2:19 PM  Result Value Ref Range   C Diff antigen NEGATIVE NEGATIVE   C Diff toxin NEGATIVE NEGATIVE   C Diff interpretation No C. difficile detected.   Type and screen [place]     Status: None   Collection Time: 11/25/15  2:19 PM  Result Value Ref Range   ABO/RH(D) B POS    Antibody Screen NEG    Sample Expiration 11/28/2015    Unit Number [id number]    Blood Component Type RED CELLS,LR    Unit division 00    Status of Unit ISSUED,FINAL    Transfusion Status OK TO TRANSFUSE    Crossmatch Result Compatible   Prepare RBC     Status: None   Collection Time: 11/25/15  2:19 PM  Result Value Ref Range   Order Confirmation ORDER PROCESSED BY BLOOD BANK   Blood Culture (routine x 2)     Status: None (Preliminary result)   Collection Time: 11/25/15  2:20 PM  Result Value Ref Range   Specimen Description BLOOD LEFT ARM    Special Requests BOTTLES DRAWN AEROBIC AND ANAEROBIC 5CC    Culture  Setup Time      GRAM POSITIVE COCCI IN BOTH AEROBIC AND ANAEROBIC BOTTLES CRITICAL RESULT CALLED TO, READ BACK BY AND VERIFIED WITH: MATT MCBANE AT 1540 11/26/15.PMH CONFIRMED BY RWW    Culture GRAM POSITIVE COCCI    Report Status PENDING   Blood Culture (routine x 2)     Status: None (Preliminary result)   Collection Time: 11/25/15  2:20 PM  Result Value Ref Range   Specimen Description BLOOD RIGHT ARM    Special Requests BOTTLES DRAWN AEROBIC AND ANAEROBIC 5CC    Culture  Setup Time      Organism ID to follow GRAM POSITIVE COCCI IN BOTH AEROBIC AND ANAEROBIC  BOTTLES CRITICAL RESULT CALLED TO, READ BACK BY AND VERIFIED WITH: MATT MCBANE AT 0867 11/26/15.PMH CONFIRMED BY RWW    Culture GRAM POSITIVE COCCI    Report Status PENDING   Lactic acid, plasma     Status: Abnormal   Collection Time: 11/25/15  2:20 PM  Result Value Ref Range   Lactic Acid, Venous 2.2 (HH) 0.5 - 1.9 mmol/L    Comment: CRITICAL RESULT CALLED TO, READ BACK BY AND VERIFIED WITH ALICIA GRANGER 61/95/09 @ 1517  [place]   Blood Culture ID Panel (Reflexed)     Status: Abnormal   Collection Time: 11/25/15  2:20 PM  Result Value Ref Range   Enterococcus species NOT DETECTED NOT DETECTED   Listeria monocytogenes NOT DETECTED NOT DETECTED   Staphylococcus species DETECTED (A) NOT DETECTED    Comment: CRITICAL RESULT CALLED TO, READ BACK BY AND VERIFIED WITH: MATT MCBANE AT 3267 11/26/15.PMH    Staphylococcus aureus DETECTED (A) NOT DETECTED    Comment: CRITICAL RESULT CALLED TO, READ BACK BY AND VERIFIED WITH: MATT MCBANE AT 1245 11/26/15.PMH    Methicillin resistance DETECTED (A) NOT DETECTED    Comment: CRITICAL RESULT CALLED TO, READ BACK BY AND VERIFIED WITH: MATT MCBANE AT 8099 11/26/15.PMH  Streptococcus species NOT DETECTED NOT DETECTED   Streptococcus agalactiae NOT DETECTED NOT DETECTED   Streptococcus pneumoniae NOT DETECTED NOT DETECTED   Streptococcus pyogenes NOT DETECTED NOT DETECTED   Acinetobacter baumannii NOT DETECTED NOT DETECTED   Enterobacteriaceae species NOT DETECTED NOT DETECTED   Enterobacter cloacae complex NOT DETECTED NOT DETECTED   Escherichia coli NOT DETECTED NOT DETECTED   Klebsiella oxytoca NOT DETECTED NOT DETECTED   Klebsiella pneumoniae NOT DETECTED NOT DETECTED   Proteus species NOT DETECTED NOT DETECTED   Serratia marcescens NOT DETECTED NOT DETECTED   Haemophilus influenzae NOT DETECTED NOT DETECTED   Neisseria meningitidis NOT DETECTED NOT DETECTED   Pseudomonas aeruginosa NOT DETECTED NOT DETECTED   Candida albicans NOT  DETECTED NOT DETECTED   Candida glabrata NOT DETECTED NOT DETECTED   Candida krusei NOT DETECTED NOT DETECTED   Candida parapsilosis NOT DETECTED NOT DETECTED   Candida tropicalis NOT DETECTED NOT DETECTED  Lactic acid, plasma     Status: None   Collection Time: 11/25/15  3:59 PM  Result Value Ref Range   Lactic Acid, Venous 1.1 0.5 - 1.9 mmol/L  Influenza panel by PCR (type A & B, H1N1)     Status: None   Collection Time: 11/25/15  3:59 PM  Result Value Ref Range   Influenza A By PCR NEGATIVE NEGATIVE   Influenza B By PCR NEGATIVE NEGATIVE   H1N1 flu by pcr NOT DETECTED NOT DETECTED    Comment:        The Xpert Flu assay (FDA approved for nasal aspirates or washes and nasopharyngeal swab specimens), is intended as an aid in the diagnosis of influenza and should not be used as a sole basis for treatment.   MRSA PCR Screening     Status: Abnormal   Collection Time: 11/25/15  3:59 PM  Result Value Ref Range   MRSA by PCR POSITIVE (A) NEGATIVE    Comment:        The GeneXpert MRSA Assay (FDA approved for NASAL specimens only), is one component of a comprehensive MRSA colonization surveillance program. It is not intended to diagnose MRSA infection nor to guide or monitor treatment for MRSA infections. RESULT CALLED TO, READ BACK BY AND VERIFIED WITH: ALICIA GRANGER ON 97/98/92 AT 1721 [facility]   Hemoglobin     Status: Abnormal   Collection Time: 11/25/15 10:00 PM  Result Value Ref Range   Hemoglobin 9.7 (L) 12.0 - 16.0 g/dL  Sedimentation rate     Status: None   Collection Time: 11/25/15 10:00 PM  Result Value Ref Range   Sed Rate 15 0 - 30 mm/hr  Gastrointestinal Panel by PCR , Stool     Status: None   Collection Time: 11/25/15 10:35 PM  Result Value Ref Range   Campylobacter species NOT DETECTED NOT DETECTED   Plesimonas shigelloides NOT DETECTED NOT DETECTED   Salmonella species NOT DETECTED NOT DETECTED   Yersinia enterocolitica NOT DETECTED NOT DETECTED   Vibrio  species NOT DETECTED NOT DETECTED   Vibrio cholerae NOT DETECTED NOT DETECTED   Enteroaggregative E coli (EAEC) NOT DETECTED NOT DETECTED   Enteropathogenic E coli (EPEC) NOT DETECTED NOT DETECTED   Enterotoxigenic E coli (ETEC) NOT DETECTED NOT DETECTED   Shiga like toxin producing E coli (STEC) NOT DETECTED NOT DETECTED   Shigella/Enteroinvasive E coli (EIEC) NOT DETECTED NOT DETECTED   Cryptosporidium NOT DETECTED NOT DETECTED   Cyclospora cayetanensis NOT DETECTED NOT DETECTED   Entamoeba histolytica NOT DETECTED NOT  DETECTED   Giardia lamblia NOT DETECTED NOT DETECTED   Adenovirus F40/41 NOT DETECTED NOT DETECTED   Astrovirus NOT DETECTED NOT DETECTED   Norovirus GI/GII NOT DETECTED NOT DETECTED   Rotavirus A NOT DETECTED NOT DETECTED   Sapovirus (I, II, IV, and V) NOT DETECTED NOT DETECTED  Basic metabolic panel     Status: Abnormal   Collection Time: 11/26/15  4:25 AM  Result Value Ref Range   Sodium 140 135 - 145 mmol/L   Potassium 3.6 3.5 - 5.1 mmol/L   Chloride 113 (H) 101 - 111 mmol/L   CO2 16 (L) 22 - 32 mmol/L   Glucose, Bld 314 (H) 65 - 99 mg/dL   BUN 31 (H) 6 - 20 mg/dL   Creatinine, Ser 0.72 0.44 - 1.00 mg/dL   Calcium 7.7 (L) 8.9 - 10.3 mg/dL   GFR calc non Af Amer >60 >60 mL/min   GFR calc Af Amer >60 >60 mL/min    Comment: (NOTE) The eGFR has been calculated using the CKD EPI equation. This calculation has not been validated in all clinical situations. eGFR's persistently <60 mL/min signify possible Chronic Kidney Disease.    Anion gap 11 5 - 15  CBC     Status: Abnormal   Collection Time: 11/26/15  4:25 AM  Result Value Ref Range   WBC 23.3 (H) 3.6 - 11.0 K/uL   RBC 3.38 (L) 3.80 - 5.20 MIL/uL   Hemoglobin 9.6 (L) 12.0 - 16.0 g/dL   HCT 29.7 (L) 35.0 - 47.0 %   MCV 88.1 80.0 - 100.0 fL   MCH 28.6 26.0 - 34.0 pg   MCHC 32.4 32.0 - 36.0 g/dL   RDW 16.8 (H) 11.5 - 14.5 %   Platelets 298 150 - 440 K/uL  Glucose, capillary     Status: Abnormal    Collection Time: 11/26/15  2:02 PM  Result Value Ref Range   Glucose-Capillary 403 (H) 65 - 99 mg/dL   Comment 1 Notify RN    Comment 2 Document in Chart    No components found for: ESR, C REACTIVE PROTEIN MICRO: Recent Results (from the past 720 hour(s))  C difficile quick scan w PCR reflex     Status: None   Collection Time: 11/25/15  2:19 PM  Result Value Ref Range Status   C Diff antigen NEGATIVE NEGATIVE Final   C Diff toxin NEGATIVE NEGATIVE Final   C Diff interpretation No C. difficile detected.  Final  Blood Culture (routine x 2)     Status: None (Preliminary result)   Collection Time: 11/25/15  2:20 PM  Result Value Ref Range Status   Specimen Description BLOOD LEFT ARM  Final   Special Requests BOTTLES DRAWN AEROBIC AND ANAEROBIC 5CC  Final   Culture  Setup Time   Final    GRAM POSITIVE COCCI IN BOTH AEROBIC AND ANAEROBIC BOTTLES CRITICAL RESULT CALLED TO, READ BACK BY AND VERIFIED WITH: MATT MCBANE AT 8299 11/26/15.PMH CONFIRMED BY RWW    Culture GRAM POSITIVE COCCI  Final   Report Status PENDING  Incomplete  Blood Culture (routine x 2)     Status: None (Preliminary result)   Collection Time: 11/25/15  2:20 PM  Result Value Ref Range Status   Specimen Description BLOOD RIGHT ARM  Final   Special Requests BOTTLES DRAWN AEROBIC AND ANAEROBIC 5CC  Final   Culture  Setup Time   Final    Organism ID to follow GRAM POSITIVE COCCI IN BOTH  AEROBIC AND ANAEROBIC BOTTLES CRITICAL RESULT CALLED TO, READ BACK BY AND VERIFIED WITH: MATT MCBANE AT 2956 11/26/15.PMH CONFIRMED BY RWW    Culture GRAM POSITIVE COCCI  Final   Report Status PENDING  Incomplete  Blood Culture ID Panel (Reflexed)     Status: Abnormal   Collection Time: 11/25/15  2:20 PM  Result Value Ref Range Status   Enterococcus species NOT DETECTED NOT DETECTED Final   Listeria monocytogenes NOT DETECTED NOT DETECTED Final   Staphylococcus species DETECTED (A) NOT DETECTED Final    Comment: CRITICAL RESULT  CALLED TO, READ BACK BY AND VERIFIED WITH: MATT MCBANE AT 2130 11/26/15.PMH    Staphylococcus aureus DETECTED (A) NOT DETECTED Final    Comment: CRITICAL RESULT CALLED TO, READ BACK BY AND VERIFIED WITH: MATT MCBANE AT 8657 11/26/15.PMH    Methicillin resistance DETECTED (A) NOT DETECTED Final    Comment: CRITICAL RESULT CALLED TO, READ BACK BY AND VERIFIED WITH: MATT MCBANE AT 8469 11/26/15.PMH    Streptococcus species NOT DETECTED NOT DETECTED Final   Streptococcus agalactiae NOT DETECTED NOT DETECTED Final   Streptococcus pneumoniae NOT DETECTED NOT DETECTED Final   Streptococcus pyogenes NOT DETECTED NOT DETECTED Final   Acinetobacter baumannii NOT DETECTED NOT DETECTED Final   Enterobacteriaceae species NOT DETECTED NOT DETECTED Final   Enterobacter cloacae complex NOT DETECTED NOT DETECTED Final   Escherichia coli NOT DETECTED NOT DETECTED Final   Klebsiella oxytoca NOT DETECTED NOT DETECTED Final   Klebsiella pneumoniae NOT DETECTED NOT DETECTED Final   Proteus species NOT DETECTED NOT DETECTED Final   Serratia marcescens NOT DETECTED NOT DETECTED Final   Haemophilus influenzae NOT DETECTED NOT DETECTED Final   Neisseria meningitidis NOT DETECTED NOT DETECTED Final   Pseudomonas aeruginosa NOT DETECTED NOT DETECTED Final   Candida albicans NOT DETECTED NOT DETECTED Final   Candida glabrata NOT DETECTED NOT DETECTED Final   Candida krusei NOT DETECTED NOT DETECTED Final   Candida parapsilosis NOT DETECTED NOT DETECTED Final   Candida tropicalis NOT DETECTED NOT DETECTED Final  MRSA PCR Screening     Status: Abnormal   Collection Time: 11/25/15  3:59 PM  Result Value Ref Range Status   MRSA by PCR POSITIVE (A) NEGATIVE Final    Comment:        The GeneXpert MRSA Assay (FDA approved for NASAL specimens only), is one component of a comprehensive MRSA colonization surveillance program. It is not intended to diagnose MRSA infection nor to guide or monitor treatment  for MRSA infections. RESULT CALLED TO, READ BACK BY AND VERIFIED WITH: ALICIA GRANGER ON 62/95/28 AT 1721 [facility]   Gastrointestinal Panel by PCR , Stool     Status: None   Collection Time: 11/25/15 10:35 PM  Result Value Ref Range Status   Campylobacter species NOT DETECTED NOT DETECTED Final   Plesimonas shigelloides NOT DETECTED NOT DETECTED Final   Salmonella species NOT DETECTED NOT DETECTED Final   Yersinia enterocolitica NOT DETECTED NOT DETECTED Final   Vibrio species NOT DETECTED NOT DETECTED Final   Vibrio cholerae NOT DETECTED NOT DETECTED Final   Enteroaggregative E coli (EAEC) NOT DETECTED NOT DETECTED Final   Enteropathogenic E coli (EPEC) NOT DETECTED NOT DETECTED Final   Enterotoxigenic E coli (ETEC) NOT DETECTED NOT DETECTED Final   Shiga like toxin producing E coli (STEC) NOT DETECTED NOT DETECTED Final   Shigella/Enteroinvasive E coli (EIEC) NOT DETECTED NOT DETECTED Final   Cryptosporidium NOT DETECTED NOT DETECTED Final   Cyclospora cayetanensis  NOT DETECTED NOT DETECTED Final   Entamoeba histolytica NOT DETECTED NOT DETECTED Final   Giardia lamblia NOT DETECTED NOT DETECTED Final   Adenovirus F40/41 NOT DETECTED NOT DETECTED Final   Astrovirus NOT DETECTED NOT DETECTED Final   Norovirus GI/GII NOT DETECTED NOT DETECTED Final   Rotavirus A NOT DETECTED NOT DETECTED Final   Sapovirus (I, II, IV, and V) NOT DETECTED NOT DETECTED Final    IMAGING: Dg Chest Port 1 View  Result Date: 11/25/2015 CLINICAL DATA:  Weakness, diarrhea. EXAM: PORTABLE CHEST 1 VIEW COMPARISON:  Radiograph of October 22, 2015. FINDINGS: The heart size and mediastinal contours are within normal limits. Both lungs are clear. Atherosclerosis of thoracic aorta is noted. Status post coronary bypass graft. Status post left shoulder arthroplasty. No pneumothorax or pleural effusion is noted. IMPRESSION: No acute cardiopulmonary abnormality seen.  Aortic atherosclerosis. Electronically Signed   By:  Marijo Conception, M.D.   On: 11/25/2015 14:19   Dg Foot Complete Left  Result Date: 11/25/2015 CLINICAL DATA:  Laceration on top of the foot are around the second and third digits. EXAM: LEFT FOOT - COMPLETE 3+ VIEW COMPARISON:  Pelvis and left hip 09/11/2015 FINDINGS: The bones are diffusely osteopenic. Atherosclerotic vascular calcifications noted. There is soft tissue swelling over the dorsum of the left at the level of the metatarsals. No soft tissue gas or foreign body is identified. IMPRESSION: Diffuse osteopenia.  No acute fracture identified. Soft tissue swelling over the dorsum of the foot. No radiopaque foreign body or soft tissue gas. Electronically Signed   By: Curlene Dolphin M.D.   On: 11/25/2015 18:30    Assessment:   Aryannah Mohon is a 82 y.o. female with dementia, largely bedbound admitted with AMS, WBC 29, febrile, necrotic heel ulcer and MRSA bacteremia.  She had a hip fx and repair in august. Family is trying to decide on comfort care vs treatment  Recommendations Continue vanco Can dc cefepime Repeat bcx.  If family wishes aggressive care will need TTE and then picc and 2 weeks min IV vanco but can hold on this until family decision.  Thank you very much for allowing me to participate in the care of this patient. Please call with questions.   Cheral Marker. Ola Spurr, MD

## 2015-11-26 NOTE — Evaluation (Signed)
Clinical/Bedside Swallow Evaluation Patient Details  Name: Kelli Valenzuela MRN: [id number] Date of Birth: 09/28/1925  Today's Date: 11/26/2015 Time: SLP Start Time (ACUTE ONLY): 0840 SLP Stop Time (ACUTE ONLY): 0915 SLP Time Calculation (min) (ACUTE ONLY): 35 min  Past Medical History:  Past Medical History:  Diagnosis Date  . Coronary artery disease    s/p MI  . Dementia   . Depression   . Diabetes mellitus without complication (HCC)    secondary to gallstone pancreatitis and subsequent pancreatitc destruction  . Hyperlipidemia   . Peripheral vascular disease [facility](HCC)    Past Surgical History:  Past Surgical History:  Procedure Laterality Date  . EXPLORATORY LAPAROTOMY  1990   multiple  . INTRAMEDULLARY (IM) NAIL INTERTROCHANTERIC Left 09/12/2015   Procedure: INTRAMEDULLARY (IM) NAIL INTERTROCHANTRIC;  Surgeon: Juanell FairlyKevin Krasinski, MD;  Location: [facility] ORS;  Service: Orthopedics;  Laterality: Left;  . PERCUTANEOUS CORONARY STENT INTERVENTION (PCI-S)     LAD   HPI:  Kelli Sangnna Sellersis a 82 [facility] a [facility] of weakness and 2 days of diarrhea associated with fever and elevated blood glucose. Patient I am able to provide any history. Patient arrives to the ER hypotensive and tachycardic. Has dark tar and melanotic stools however patient does reportedly take iron supplementation. Presentation, She does have a significant history of cardiac disease and is on Plavix   Assessment / Plan / Recommendation Clinical Impression  Pt present swith moderate oral phase dysphagia c/b decreased bolus manipulation of PO textures. Pt with functional manipulation of puree with complete oral clearing. Pt with baseline cognitive deficits that prevent her from following directions, using compensatory strategies and unable to open mouth for oral checks. Given this and decreased bolus manipulation of puree, soft solids were not indicated this session. ST recommends  conservative diet of dysphagia 1 with thin liuqids via straw and medicine crushed in puree following general aspiration precautions. ST recommends full nursing supervision with PO intake d/t cognitive deficits and inability to feed herself.     Aspiration Risk  Moderate aspiration risk    Diet Recommendation Dysphagia 1 (Puree);Thin liquid   Liquid Administration via: Straw Medication Administration: Crushed with puree Compensations: Minimize environmental distractions;Slow rate;Small sips/bites;Follow solids with liquid Postural Changes: Seated upright at 90 degrees    Other  Recommendations Oral Care Recommendations: Oral care BID   Follow up Recommendations Skilled Nursing facility      Frequency and Duration min 2x/week  2 weeks       Prognosis Prognosis for Safe Diet Advancement: Good Barriers to Reach Goals: Cognitive deficits      Swallow Study   General Date of Onset: 11/25/15 HPI: Kelli Sangnna Sellersis a 82 [facility] of weakness and 2 days of diarrhea associated with fever and elevated blood glucose. Patient I am able to provide any history. Patient arrives to the ER hypotensive and tachycardic. Has dark tar and melanotic stools however patient does reportedly take iron supplementation. Presentation, She does have a significant history of cardiac disease and is on Plavix Type of Study: Bedside Swallow Evaluation Diet Prior to this Study: NPO Temperature Spikes Noted: No Respiratory Status: Room air History of Recent Intubation: No Behavior/Cognition: Alert;Requires cueing;Doesn't follow directions Oral Cavity Assessment: Within Functional Limits Oral Care Completed by SLP: Recent completion by staff Oral Cavity - Dentition: Edentulous Vision:  (N/A unable to feed self) Self-Feeding Abilities: Total assist Patient Positioning: Upright in bed Baseline Vocal Quality: Normal Volitional Cough:  Cognitively unable to  elicit Volitional Swallow: Unable to elicit    Oral/Motor/Sensory Function Overall Oral Motor/Sensory Function: Within functional limits   Ice Chips Ice chips: Within functional limits Presentation: Spoon Other Comments:  (ST fed 10 trials without s/s of aspriation)   Thin Liquid Thin Liquid: Within functional limits Presentation: Straw;Spoon Other Comments:  (ST fed 6 oz without overt s/s of aspiration)    Nectar Thick Nectar Thick Liquid: Not tested   Honey Thick Honey Thick Liquid: Not tested   Puree Puree: Within functional limits Presentation: Spoon   Solid   GO   Solid: Not tested       Srijan Givan B. Dreama Saaverton, M.S., CCC-SLP Speech-Language Pathologist  Kohlton Gilpatrick 11/26/2015,9:48 AM

## 2015-11-26 NOTE — Progress Notes (Signed)
Diabetic coordinator spoke with RN about patient having history of Diabetes. RN notified MD. MD ordered CBG monitoring and Novolog sliding scale. Patient blood sugar 403. MD ordered RN to give 9 units of Novolog and 8 units of Lantus.  Harvie HeckMelanie Teagen Mcleary, RN

## 2015-11-26 NOTE — Progress Notes (Signed)
LCSW is following as patient is from [facility], memory care unit. Consult for SNF, however will defer at this time until palliative has met with family regarding goals of care.  Following for emotional support and disposition.   Lane Hacker, MSW Clinical Social Work: Printmaker Coverage for :  [phone]

## 2015-11-26 NOTE — Progress Notes (Signed)
Initial Nutrition Assessment  DOCUMENTATION CODES:   Severe malnutrition in context of chronic illness  INTERVENTION:  Encourage PO intake Pt somnolent, difficult to arouse, probably won't consume ONS Will provide Mighty Shake II with Lunch, each supplement provides 480-500 kcals and 20-23 grams of protein Continue NDD1-Thin per SLP  NUTRITION DIAGNOSIS:   Malnutrition related to chronic illness as evidenced by severe depletion of muscle mass, severe depletion of body fat, moderate depletion of body fat, percent weight loss.  GOAL:   Patient will meet greater than or equal to 90% of their needs  MONITOR:   PO intake, I & O's, Labs, Weight trends  REASON FOR ASSESSMENT:   Malnutrition Screening Tool    ASSESSMENT:   Kelli Valenzuela  is a 82 y.o. female sent in by facility for diarrhea and high sugars. Patient unable to give any history. Patient does have a history of dementia but is normally able to communicate. Family noticed that she's been going downhill since her husband died and now is in a memory care unit is mostly wheelchair bound Found to be suffering from septic shock, hypovolemic shock with upper GI Bleed.  Patient unable to provide any history  Nutrition-Focused physical exam completed. Findings are moderate-severe fat depletion, severe muscle depletion, and no edema.   Exhibits a 10#/7% insignificant wt loss over 7 months.  Labs and medications reviewed. NS @ 8350mL/hr  Diet Order:  DIET - DYS 1 Room service appropriate? Yes; Fluid consistency: Thin  Skin:  Wound (see comment) (Diabetic Foot Ulcer, PU to Heel)  Last BM:  PTA  Height:   Ht Readings from Last 1 Encounters:  11/25/15 5\' 4"  (1.626 m)    Weight:   Wt Readings from Last 1 Encounters:  11/25/15 130 lb (59 kg)    Ideal Body Weight:  54.54 kg  BMI:  Body mass index is 22.31 kg/m.  Estimated Nutritional Needs:   Kcal:  1400-1600 calories  Protein:  60-75 gm  Fluid:  >/=  1.4L  EDUCATION NEEDS:   No education needs identified at this time  Dionne AnoWilliam M. Solymar Grace, MS, RD LDN Inpatient Clinical Dietitian Pager [phone]

## 2015-11-26 NOTE — Progress Notes (Signed)
Speech therapist have evaluated patient for diet. RN ordered house tray of dysphagia 1 diet with thin liquids. Patient woke up to eat. Ate 50% of food and most liquids. Patient left sitting in upright position.   Harvie HeckMelanie Jakota Manthei, RN

## 2015-11-26 NOTE — Progress Notes (Signed)
Pharmacy Antibiotic Note  Kelli Valenzuela is a 82 y.o. female admitted on 11/25/2015 with necrotic heel ulcer and MRSA bacteremia.  Pharmacy has been consulted for vancomycin dosing.  Plan: Will continue vancomycin 750mg  IV Q24hr for goal trough of 15-20. Will obtain trough prior to am dose on 11/29/15.     Height: 5\' 4"  (162.6 cm) Weight: 130 lb (59 kg) IBW/kg (Calculated) : 54.7  Temp (24hrs), Avg:99 F (37.2 C), Min:98.1 F (36.7 C), Max:100.8 F (38.2 C)   Recent Labs Lab 11/25/15 1324 11/25/15 1420 11/25/15 1559 11/26/15 0425  WBC 28.8*  --   --  23.3*  CREATININE 0.97  --   --  0.72  LATICACIDVEN  --  2.2* 1.1  --     Estimated Creatinine Clearance: 42 mL/min (by C-G formula based on SCr of 0.72 mg/dL).    Allergies  Allergen Reactions  . Sulfa Antibiotics Hives    Antimicrobials this admission: Cefepime 10/29 >> 10/30  Vancomycin 10/30 >>   Dose adjustments this admission: N/A  Microbiology results: 10/29 BCx: gram positive cocci, MRSA on BCID  10/30 BCx: sent  10/29 MRSA PCR: positive  10/29 Cdiff: negative  Pharmacy will continue to monitor and adjust per consult.   MLS 11/26/2015 5:01 PM

## 2015-11-26 NOTE — Progress Notes (Signed)
Inpatient Diabetes Program Recommendations  AACE/ADA: New Consensus Statement on Inpatient Glycemic Control (2015)  Target Ranges:  Prepandial:   less than 140 mg/dL      Peak postprandial:   less than 180 mg/dL (1-2 hours)      Critically ill patients:  140 - 180 mg/dL   Lab Results  Component Value Date   GLUCAP 286 (H) 10/23/2015   HGBA1C 7.5 (H) 09/11/2015    Review of Glycemic Control:  Results for Kelli Valenzuela, Kelli Valenzuela (MRN [id number]) as of 11/26/2015 11:51  Ref. Range 11/25/2015 13:24 11/26/2015 04:25  Glucose Latest Ref Range: 65 - 99 mg/dL 409243 (H) 811314 (H)    Diabetes history: Diabetes mellitus (due to pancreatic destruction) Outpatient Diabetes medications: Lantus 15 units daily, Novolog 4 units tid with meals Current orders for Inpatient glycemic control:  None Inpatient Diabetes Program Recommendations:   Text paged MD regarding recommendations for Lantus 8 units daily and Novolog sensitive correction tid with meals.    Thanks, Kelli MeagerJenny Joangel Vanosdol, RN, BC-ADM Inpatient Diabetes Coordinator Pager [phone] (8a-5p)

## 2015-11-27 DIAGNOSIS — Z515 Encounter for palliative care: Secondary | ICD-10-CM

## 2015-11-27 DIAGNOSIS — R7881 Bacteremia: Secondary | ICD-10-CM

## 2015-11-27 LAB — BLOOD CULTURE ID PANEL (REFLEXED)
ACINETOBACTER BAUMANNII: NOT DETECTED
CANDIDA KRUSEI: NOT DETECTED
CANDIDA PARAPSILOSIS: NOT DETECTED
CANDIDA TROPICALIS: NOT DETECTED
Candida albicans: NOT DETECTED
Candida glabrata: NOT DETECTED
ESCHERICHIA COLI: NOT DETECTED
Enterobacter cloacae complex: NOT DETECTED
Enterobacteriaceae species: NOT DETECTED
Enterococcus species: NOT DETECTED
HAEMOPHILUS INFLUENZAE: NOT DETECTED
KLEBSIELLA OXYTOCA: NOT DETECTED
KLEBSIELLA PNEUMONIAE: NOT DETECTED
Listeria monocytogenes: NOT DETECTED
METHICILLIN RESISTANCE: DETECTED — AB
Neisseria meningitidis: NOT DETECTED
PROTEUS SPECIES: NOT DETECTED
PSEUDOMONAS AERUGINOSA: NOT DETECTED
SERRATIA MARCESCENS: NOT DETECTED
STAPHYLOCOCCUS AUREUS BCID: DETECTED — AB
STREPTOCOCCUS PNEUMONIAE: NOT DETECTED
STREPTOCOCCUS PYOGENES: NOT DETECTED
Staphylococcus species: DETECTED — AB
Streptococcus agalactiae: NOT DETECTED
Streptococcus species: NOT DETECTED

## 2015-11-27 LAB — CBC
HCT: 28 % — ABNORMAL LOW (ref 35.0–47.0)
HEMOGLOBIN: 9.5 g/dL — AB (ref 12.0–16.0)
MCH: 28.7 pg (ref 26.0–34.0)
MCHC: 33.9 g/dL (ref 32.0–36.0)
MCV: 84.8 fL (ref 80.0–100.0)
Platelets: 270 10*3/uL (ref 150–440)
RBC: 3.3 MIL/uL — AB (ref 3.80–5.20)
RDW: 17.3 % — ABNORMAL HIGH (ref 11.5–14.5)
WBC: 20.1 10*3/uL — AB (ref 3.6–11.0)

## 2015-11-27 MED ORDER — MORPHINE SULFATE (PF) 2 MG/ML IV SOLN: 1.0000 mg | INTRAVENOUS | Status: DC | PRN

## 2015-11-27 MED ORDER — MORPHINE SULFATE (CONCENTRATE) 10 MG/0.5ML PO SOLN: 5.0000 mg | ORAL | 0 refills | Status: AC | PRN

## 2015-11-27 MED ORDER — LORAZEPAM 2 MG/ML PO CONC: 0.5000 mg | Freq: Four times a day (QID) | ORAL | Status: DC | PRN

## 2015-11-27 MED ORDER — LORAZEPAM 2 MG/ML PO CONC: 0.6000 mg | Freq: Four times a day (QID) | ORAL | 0 refills | Status: AC | PRN

## 2015-11-27 MED ORDER — POLYVINYL ALCOHOL 1.4 % OP SOLN
1.0000 [drp] | Freq: Four times a day (QID) | OPHTHALMIC | Status: DC | PRN
  Filled 2015-11-27: qty 15

## 2015-11-27 MED ORDER — LORAZEPAM 2 MG/ML IJ SOLN: 0.5000 mg | Freq: Four times a day (QID) | INTRAMUSCULAR | 0 refills | Status: DC | PRN

## 2015-11-27 MED ORDER — LORAZEPAM 2 MG/ML IJ SOLN: 0.5000 mg | Freq: Four times a day (QID) | INTRAMUSCULAR | Status: DC | PRN

## 2015-11-27 MED ORDER — MORPHINE SULFATE (CONCENTRATE) 10 MG/0.5ML PO SOLN
5.0000 mg | ORAL | Status: DC | PRN
  Administered 2015-11-27: 5 mg via ORAL
  Filled 2015-11-27: qty 1

## 2015-11-27 MED ORDER — GLYCOPYRROLATE 0.2 MG/ML IJ SOLN
0.2000 mg | INTRAMUSCULAR | Status: DC | PRN
  Filled 2015-11-27: qty 1

## 2015-11-27 NOTE — Progress Notes (Signed)
Patient resting comfortably in bed. In no apparent distress, family at bedside.

## 2015-11-27 NOTE — Care Management Important Message (Signed)
Important Message  Patient Details  Name: Velna Hatchetnna Halm MRN: [id number] Date of Birth: 04/20/1925   Medicare Important Message Given:  Yes    Marily MemosLisa M Karlie Aung, RN 11/27/2015, 2:02 PM

## 2015-11-27 NOTE — Discharge Summary (Signed)
[facility] [place] at [facility]   PATIENT NAME: Kelli Valenzuela    MR#:  [id number]  DATE OF BIRTH:  March 24, 1925  DATE OF ADMISSION:  11/25/2015 ADMITTING PHYSICIAN: Alford Highland, MD  DATE OF DISCHARGE:11/27/15  PRIMARY CARE PHYSICIAN: Pcp Not In System    ADMISSION DIAGNOSIS:  Melena [K92.1] Heel ulcer due to DM (HCC) [E11.621, L97.409] Elevated troponin I level [R74.8] Sepsis, due to unspecified organism (HCC) [A41.9] Leukocytosis, unspecified type [D72.829]  DISCHARGE DIAGNOSIS:  MRSA Septicemia Failure to Thrive  SECONDARY DIAGNOSIS:   Past Medical History:  Diagnosis Date  . Coronary artery disease    s/p MI  . Dementia   . Depression   . Diabetes mellitus without complication (HCC)    secondary to gallstone pancreatitis and subsequent pancreatitc destruction  . Hyperlipidemia   . Peripheral vascular disease [facility])     HOSPITAL COURSE:   AnnaSellersis a 82 y.o.femalesent in by facility for diarrhea and high sugars. Patient unable to give any history. Patient does have a history of dementia but is normally able to communicate. Family noticed that she's been going downhill since her husband died and now is in a memory care unit is mostly wheelchair bound. This a.m. she was real lethargic. She's had diarrhea for 2 days  1. Shock which is a combination of septic shock and hypovolemic shock.  -Family declined pressors at this time.  -Patient is a DO NOT RESUSCITATE.   2. Septic shock. Secondary to MRSA septicemia  - Left heel has a necrotic area on it which could be source of infection. Chest x-ray negative at this time. -Co  3. Hypovolemic shock from upper GI bleed. Hold the Plavix and Mobitz at this time.   4. Type 2 diabetes mellitus.   5. Acute encephalopathy likely secondary to sepsis  6. Dementia. .  7. History coronary artery disease  Spoke with son Kelli Valenzuela and dter last pm on phone. They request comfort  measures and pt will be going to hospice facility today  CONSULTS OBTAINED:  Treatment Team:  Mick Sell, MD  DRUG ALLERGIES:   Allergies  Allergen Reactions  . Sulfa Antibiotics Hives    DISCHARGE MEDICATIONS:   Current Discharge Medication List    START taking these medications   Details  LORazepam (ATIVAN) 2 MG/ML concentrated solution Take 0.3 mLs (0.6 mg total) by mouth every 6 (six) hours as needed for anxiety. Qty: 30 mL, Refills: 0    Morphine Sulfate (MORPHINE CONCENTRATE) 10 MG/0.5ML SOLN concentrated solution Take 0.25 mLs (5 mg total) by mouth every 4 (four) hours as needed for moderate pain, severe pain or shortness of breath. Qty: 180 mL, Refills: 0      CONTINUE these medications which have NOT CHANGED   Details  acetaminophen (TYLENOL) 500 MG tablet Take 500 mg by mouth 3 (three) times daily.      STOP taking these medications     carvedilol (COREG) 6.25 MG tablet      clopidogrel (PLAVIX) 75 MG tablet      docusate sodium (COLACE) 100 MG capsule      ferrous sulfate 325 (65 FE) MG tablet      insulin aspart (NOVOLOG) 100 UNIT/ML injection      insulin glargine (LANTUS) 100 UNIT/ML injection      lisinopril (PRINIVIL,ZESTRIL) 5 MG tablet      meloxicam (MOBIC) 7.5 MG tablet      metFORMIN (GLUCOPHAGE) 500 MG tablet  senna (SENOKOT) 8.6 MG tablet      sertraline (ZOLOFT) 50 MG tablet      Vitamin D, Ergocalciferol, (DRISDOL) 50000 units CAPS capsule         If you experience worsening of your admission symptoms, develop shortness of breath, life threatening emergency, suicidal or homicidal thoughts you must seek medical attention immediately by calling 911 or calling your MD immediately  if symptoms less severe.  You Must read complete instructions/literature along with all the possible adverse reactions/side effects for all the Medicines you take and that have been prescribed to you. Take any new Medicines after you have  completely understood and accept all the possible adverse reactions/side effects.   Please note  You were cared for by a hospitalist during your hospital stay. If you have any questions about your discharge medications or the care you received while you were in the hospital after you are discharged, you can call the unit and asked to speak with the hospitalist on call if the hospitalist that took care of you is not available. Once you are discharged, your primary care physician will handle any further medical issues. Please note that NO REFILLS for any discharge medications will be authorized once you are discharged, as it is imperative that you return to your primary care physician (or establish a relationship with a primary care physician if you do not have one) for your aftercare needs so that they can reassess your need for medications and monitor your lab values. Today   SUBJECTIVE  Lethargic family in the room   VITAL SIGNS:  Blood pressure (!) 100/55, pulse 95, temperature 98.7 F (37.1 C), temperature source Axillary, resp. rate (!) 26, height 5\' 4"  (1.626 m), weight 59 kg (130 lb), SpO2 96 %.  I/O:   Intake/Output Summary (Last 24 hours) at 11/27/15 1109 Last data filed at 11/26/15 1830  Gross per 24 hour  Intake          1267.49 ml  Output                0 ml  Net          1267.49 ml    PHYSICAL EXAMINATION:  GENERAL:  82 y.o.-year-old patient lying in the bed with no acute distress. Critically ill Limited exam EYES: Pupils equal, round, reactive to light and accommodation. HEENT: Head atraumatic, normocephalic. Oropharynx and nasopharynx clear.  NECK:  Supple, no jugular venous distention. No thyroid enlargement, no tenderness.  LUNGS: Normal breath sounds bilaterally, no wheezing, rales,rhonchi or crepitation. No use of accessory muscles of respiration.  CARDIOVASCULAR: S1, S2 normal. No murmurs, rubs, or gallops.  ABDOMEN: Soft, non-tender, non-distended.  EXTREMITIES: No  pedal edemaNEUROLOGIC: comfort measures PSYCHIATRIC: lethargic SKIN left heel ulcer  DATA REVIEW:   CBC   Recent Labs Lab 11/27/15 0433  WBC 20.1*  HGB 9.5*  HCT 28.0*  PLT 270    Chemistries   Recent Labs Lab 11/25/15 1324 11/26/15 0425  NA 137 140  K 3.5 3.6  CL 102 113*  CO2 23 16*  GLUCOSE 243* 314*  BUN 42* 31*  CREATININE 0.97 0.72  CALCIUM 8.5* 7.7*  AST 34  --   ALT 21  --   ALKPHOS 118  --   BILITOT 0.7  --     Microbiology Results   Recent Results (from the past 240 hour(s))  C difficile quick scan w PCR reflex     Status: None   Collection Time: 11/25/15  2:19 PM  Result Value Ref Range Status   C Diff antigen NEGATIVE NEGATIVE Final   C Diff toxin NEGATIVE NEGATIVE Final   C Diff interpretation No C. difficile detected.  Final  Blood Culture (routine x 2)     Status: Abnormal (Preliminary result)   Collection Time: 11/25/15  2:20 PM  Result Value Ref Range Status   Specimen Description BLOOD LEFT ARM  Final   Special Requests BOTTLES DRAWN AEROBIC AND ANAEROBIC 5CC  Final   Culture  Setup Time   Final    GRAM POSITIVE COCCI IN BOTH AEROBIC AND ANAEROBIC BOTTLES CRITICAL RESULT CALLED TO, READ BACK BY AND VERIFIED WITH: MATT MCBANE AT [id number] 11/26/15.PMH CONFIRMED BY RWW    Culture STAPHYLOCOCCUS AUREUS (A)  Final   Report Status PENDING  Incomplete  Blood Culture (routine x 2)     Status: Abnormal (Preliminary result)   Collection Time: 11/25/15  2:20 PM  Result Value Ref Range Status   Specimen Description BLOOD RIGHT ARM  Final   Special Requests BOTTLES DRAWN AEROBIC AND ANAEROBIC 5CC  Final   Culture  Setup Time   Final    GRAM POSITIVE COCCI IN BOTH AEROBIC AND ANAEROBIC BOTTLES CRITICAL RESULT CALLED TO, READ BACK BY AND VERIFIED WITH: MATT MCBANE AT [id number] 11/26/15.PMH CONFIRMED BY RWW    Culture (A)  Final    STAPHYLOCOCCUS AUREUS SUSCEPTIBILITIES TO FO[facility]    Report Status PENDING  Incomplete   Blood Culture ID Panel (Reflexed)     Status: Abnormal   Collection Time: 11/25/15  2:20 PM  Result Value Ref Range Status   Enterococcus species NOT DETECTED NOT DETECTED Final   Listeria monocytogenes NOT DETECTED NOT DETECTED Final   Staphylococcus species DETECTED (A) NOT DETECTED Final    Comment: CRITICAL RESULT CALLED TO, READ BACK BY AND VERIFIED WITH: MATT MCBANE AT [id number] 11/26/15.PMH    Staphylococcus aureus DETECTED (A) NOT DETECTED Final    Comment: CRITICAL RESULT CALLED TO, READ BACK BY AND VERIFIED WITH: MATT MCBANE AT [id number] 11/26/15.PMH    Methicillin resistance DETECTED (A) NOT DETECTED Final    Comment: CRITICAL RESULT CALLED TO, READ BACK BY AND VERIFIED WITH: MATT MCBANE AT [id number] 11/26/15.PMH    Streptococcus species NOT DETECTED NOT DETECTED Final   Streptococcus agalactiae NOT DETECTED NOT DETECTED Final   Streptococcus pneumoniae NOT DETECTED NOT DETECTED Final   Streptococcus pyogenes NOT DETECTED NOT DETECTED Final   Acinetobacter baumannii NOT DETECTED NOT DETECTED Final   Enterobacteriaceae species NOT DETECTED NOT DETECTED Final   Enterobacter cloacae complex NOT DETECTED NOT DETECTED Final   Escherichia coli NOT DETECTED NOT DETECTED Final   Klebsiella oxytoca NOT DETECTED NOT DETECTED Final   Klebsiella pneumoniae NOT DETECTED NOT DETECTED Final   Proteus species NOT DETECTED NOT DETECTED Final   Serratia marcescens NOT DETECTED NOT DETECTED Final   Haemophilus influenzae NOT DETECTED NOT DETECTED Final   Neisseria meningitidis NOT DETECTED NOT DETECTED Final   Pseudomonas aeruginosa NOT DETECTED NOT DETECTED Final   Candida albicans NOT DETECTED NOT DETECTED Final   Candida glabrata NOT DETECTED NOT DETECTED Final   Candida krusei NOT DETECTED NOT DETECTED Final   Candida parapsilosis NOT DETECTED NOT DETECTED Final   Candida tropicalis NOT DETECTED NOT DETECTED Final  MRSA PCR Screening     Status: Abnormal   Collection Time: 11/25/15  3:59 PM   Result Value Ref Range Status   MRSA by PCR POSITIVE (A)  NEGATIVE Final    Comment:        The GeneXpert MRSA Assay (FDA approved for NASAL specimens only), is one component of a comprehensive MRSA colonization surveillance program. It is not intended to diagnose MRSA infection nor to guide or monitor treatment for MRSA infections. RESULT CALLED TO, READ BACK BY AND VERIFIED WITH: ALICIA GRANGER ON 11/25/15 AT 1721 [facility]   Gastrointestinal Panel by PCR , Stool     Status: None   Collection Time: 11/25/15 10:35 PM  Result Value Ref Range Status   Campylobacter species NOT DETECTED NOT DETECTED Final   Plesimonas shigelloides NOT DETECTED NOT DETECTED Final   Salmonella species NOT DETECTED NOT DETECTED Final   Yersinia enterocolitica NOT DETECTED NOT DETECTED Final   Vibrio species NOT DETECTED NOT DETECTED Final   Vibrio cholerae NOT DETECTED NOT DETECTED Final   Enteroaggregative E coli (EAEC) NOT DETECTED NOT DETECTED Final   Enteropathogenic E coli (EPEC) NOT DETECTED NOT DETECTED Final   Enterotoxigenic E coli (ETEC) NOT DETECTED NOT DETECTED Final   Shiga like toxin producing E coli (STEC) NOT DETECTED NOT DETECTED Final   Shigella/Enteroinvasive E coli (EIEC) NOT DETECTED NOT DETECTED Final   Cryptosporidium NOT DETECTED NOT DETECTED Final   Cyclospora cayetanensis NOT DETECTED NOT DETECTED Final   Entamoeba histolytica NOT DETECTED NOT DETECTED Final   Giardia lamblia NOT DETECTED NOT DETECTED Final   Adenovirus F40/41 NOT DETECTED NOT DETECTED Final   Astrovirus NOT DETECTED NOT DETECTED Final   Norovirus GI/GII NOT DETECTED NOT DETECTED Final   Rotavirus A NOT DETECTED NOT DETECTED Final   Sapovirus (I, II, IV, and V) NOT DETECTED NOT DETECTED Final  CULTURE, BLOOD (ROUTINE X 2) w Reflex to ID Panel     Status: None (Preliminary result)   Collection Time: 11/26/15  2:28 PM  Result Value Ref Range Status   Specimen Description BLOOD RIGHT HAND  Final   Special  Requests   Final    BOTTLES DRAWN AEROBIC AND ANAEROBIC AER 3CC ANA 2CC   Culture NO GROWTH < 24 HOURS  Final   Report Status PENDING  Incomplete  CULTURE, BLOOD (ROUTINE X 2) w Reflex to ID Panel     Status: None (Preliminary result)   Collection Time: 11/26/15  4:12 PM  Result Value Ref Range Status   Specimen Description BLOOD RIGHT HAND  Final   Special Requests BOTTLES DRAWN AEROBIC AND ANAEROBIC 1CCAERO,1CCANA  Final   Culture NO GROWTH < 24 HOURS  Final   Report Status PENDING  Incomplete    RADIOLOGY:  Dg Chest Port 1 View  Result Date: 11/25/2015 CLINICAL DATA:  Weakness, diarrhea. EXAM: PORTABLE CHEST 1 VIEW COMPARISON:  Radiograph of October 22, 2015. FINDINGS: The heart size and mediastinal contours are within normal limits. Both lungs are clear. Atherosclerosis of thoracic aorta is noted. Status post coronary bypass graft. Status post left shoulder arthroplasty. No pneumothorax or pleural effusion is noted. IMPRESSION: No acute cardiopulmonary abnormality seen.  Aortic atherosclerosis. Electronically Signed   By: Lupita Raider, M.D.   On: 11/25/2015 14:19   Dg Foot Complete Left  Result Date: 11/25/2015 CLINICAL DATA:  Laceration on top of the foot are around the second and third digits. EXAM: LEFT FOOT - COMPLETE 3+ VIEW COMPARISON:  Pelvis and left hip 09/11/2015 FINDINGS: The bones are diffusely osteopenic. Atherosclerotic vascular calcifications noted. There is soft tissue swelling over the dorsum of the left at the level of the metatarsals. No soft  tissue gas or foreign body is identified. IMPRESSION: Diffuse osteopenia.  No acute fracture identified. Soft tissue swelling over the dorsum of the foot. No radiopaque foreign body or soft tissue gas. Electronically Signed   By: Britta Mccreedy M.D.   On: 11/25/2015 18:30     Management plans discussed with the patient, family and they are in agreement.  CODE STATUS:     Code Status Orders        Start     Ordered    11/27/15 0855  Do not attempt resuscitation (DNR)  Continuous    Question Answer Comment  In the event of cardiac or respiratory ARREST Do not call a "code blue"   In the event of cardiac or respiratory ARREST Do not perform Intubation, CPR, defibrillation or ACLS   In the event of cardiac or respiratory ARREST Use medication by any route, position, wound care, and other measures to relive pain and suffering. May use oxygen, suction and manual treatment of airway obstruction as needed for comfort.      11/27/15 0855    Code Status History    Date Active Date Inactive Code Status Order ID Comments User Context   11/25/2015  5:31 PM 11/27/2015  8:55 AM DNR [id number]  Alford Highland, MD ED   11/25/2015  1:48 PM 11/25/2015  5:31 PM DNR [id number]  Willy Eddy, MD ED   10/22/2015  9:38 PM 10/23/2015  5:56 PM DNR [id number]  Houston Siren, MD Inpatient   09/11/2015  5:11 PM 09/12/2015  2:10 PM DNR [id number]  Juanell Fairly, MD Inpatient   09/11/2015  5:10 PM 09/11/2015  5:11 PM DNR [id number]  Enid Baas, MD Inpatient   09/08/2015  3:05 AM 09/09/2015  5:11 PM DNR [id number]  Arnaldo Natal, MD Inpatient   09/08/2015  2:33 AM 09/08/2015  3:05 AM Full Code [id number]  Arnaldo Natal, MD Inpatient      TOTAL TIME TAKING CARE OF THIS PATIENT:40 minutes.    Kelty Szafran M.D on 11/27/2015 at 11:09 AM  Between 7am to 6pm - Pager - [phone] After 6pm go to [email or web address] - password [email or web address]  [facility]  Office  [phone]  CC: Primary care physician; Pcp Not In System

## 2015-11-27 NOTE — Progress Notes (Signed)
I had a very long discussion with patient's daughter West BaliMary Anne and son Cherlynn KaiserMark Arp regarding patient's overall poor prognosis given her MRSA bacteremia. Family request patient be comfort table and wanted to request comfort measures and palliative consult. Patient was seen by palliative care.  Family is also requesting patient be transferred to hospice facility. Family understands patient carries a poor prognosis CODE STATUS DO NOT RESUSCITATE  Time spent 20 minutes

## 2015-11-27 NOTE — Progress Notes (Signed)
New hospice home referral received from [facility] following a Palliative Care consult. Mrs. Kelli Valenzuela is an 82 year old woman admitted form the [place] at [facility] to [facility] on 10/27 of evaluation and treatment of septic shock. She had been experiencing diarrhea and elevated blood sugars at her facility. In the ED she was found to have melena and an elevated white cell count, with positive blood cultures. She also has a necrotic left heel ulcer  She has been treated with IV antibiotics and IV fluids but has continued to decline, with continued confusion, lethargy and continued elevated white blood cell count. Pateint's family has chosen to focus on her comfort with transfer today to the hospice home. Writer met in the room with patient's daughter [name] to initiate education regarding hospice services, philosophy and team approach to care with good understanding voiced. Questions answered, consents signed. [name] related the decline she has seen in her mother since her hip fracture in August, with first decline seen after her father's death 18 months ago.  Patient noted to be restless with increased respiratory rate of 40, staff RN Marcie Bal notified and 5 mg of liquid morphine were given. She remains on 2 liters of oxygen via nasal cannula.  [name] reported that she was able to feed her mother some ice cream and coffee this morning. Patient is incontinent of bowel and bladder and  required a tylenol suppository for elevated temperature yesterday.  Patient information faxed to hospice referral. Writer to call report to the Hospice home and arrange EMS transport. Hospital care team and family all aware of and in agreement with current plan for transport to the Hospice home today via EMS. Signed portable DNR in place in patientt's discharge packet. Thank you for the opportunity to be involved in the care of this patient and her family. Flo Shanks RN, BSN, [facility] [place], hospital Liaison [phone] c

## 2015-11-27 NOTE — Progress Notes (Addendum)
Clinical Education officer, museum (CSW) received a consult for residential hospice placement. CSW met with patient's daughter Kelli Valenzuela at bedside and provided hospice facility choice. Kelli Valenzuela chose [facility]/ [facility] because she lives in [place] and her brother lives in [place] and it is half way in between for them to visit.   Per [facility] liaison patient can be transferred to the hospice home this afternoon. D/C packet complete including DNR. RN aware of above. Please reconsult if future social work needs arise. CSW signing off.   CSW contacted [facility] staff and made them aware of above.   [employer], LCSW [phone]

## 2015-11-27 NOTE — Progress Notes (Signed)
EMS notified for transport at 3:15 pm. Family and hospital care team aware. Thank you. Dayna BarkerKaren Robertson RN, BSN, [facility] [place], [facility] Liaison [phone] c

## 2015-11-27 NOTE — Progress Notes (Addendum)
PHARMACY - PHYSICIAN COMMUNICATION CRITICAL VALUE ALERT - BLOOD CULTURE IDENTIFICATION (BCID)  Results for orders placed or performed during the hospital encounter of 11/25/15  Blood Culture ID Panel (Reflexed) (Collected: 11/26/2015  4:12 PM)  Result Value Ref Range   Enterococcus species NOT DETECTED NOT DETECTED   Listeria monocytogenes NOT DETECTED NOT DETECTED   Staphylococcus species DETECTED (A) NOT DETECTED   Staphylococcus aureus DETECTED (A) NOT DETECTED   Methicillin resistance DETECTED (A) NOT DETECTED   Streptococcus species NOT DETECTED NOT DETECTED   Streptococcus agalactiae NOT DETECTED NOT DETECTED   Streptococcus pneumoniae NOT DETECTED NOT DETECTED   Streptococcus pyogenes NOT DETECTED NOT DETECTED   Acinetobacter baumannii NOT DETECTED NOT DETECTED   Enterobacteriaceae species NOT DETECTED NOT DETECTED   Enterobacter cloacae complex NOT DETECTED NOT DETECTED   Escherichia coli NOT DETECTED NOT DETECTED   Klebsiella oxytoca NOT DETECTED NOT DETECTED   Klebsiella pneumoniae NOT DETECTED NOT DETECTED   Proteus species NOT DETECTED NOT DETECTED   Serratia marcescens NOT DETECTED NOT DETECTED   Haemophilus influenzae NOT DETECTED NOT DETECTED   Neisseria meningitidis NOT DETECTED NOT DETECTED   Pseudomonas aeruginosa NOT DETECTED NOT DETECTED   Candida albicans NOT DETECTED NOT DETECTED   Candida glabrata NOT DETECTED NOT DETECTED   Candida krusei NOT DETECTED NOT DETECTED   Candida parapsilosis NOT DETECTED NOT DETECTED   Candida tropicalis NOT DETECTED NOT DETECTED    Name of physician (or Provider) Contacted: Enedina FinnerSona Patel   Changes to prescribed antibiotics required: Patient is going to hospice services therefore MD will not treat. Patient was previously on Vancomycin for MRSA bacteremia.   10/31 PM. Lab called 2nd set cultures with [facility]. Management as above.   James,Teldrin D 11/27/2015  4:27 PM

## 2015-11-27 NOTE — Consult Note (Signed)
Consultation Note Date: 11/27/2015   Patient Name: Kelli Valenzuela  DOB: 1925/12/14  MRN: [id number]  Age / Sex: 82 y.o., female  PCP: Pcp Not In System Referring Physician: Fritzi Mandes, MD  Reason for Consultation: Establishing goals of care  HPI/Patient Profile: 82 y.o. female  with past medical history of diabetes mellitus, dementia (wheelchair bound and declining), hip fracture and repair Aug 2017, CAD, depression, PVD, HLD admitted on 11/25/2015 from memory care unit with diarrhea and hyperglycemia. Found to have MRSA bacteremia and sepsis with associated encephalopathy, hypovolemia, and upper GIB.   Clinical Assessment and Goals of Care: I met this morning at Kelli Valenzuela bedside with her daughter and son, Haynes Dage (a Designer, jewellery) and Elta Guadeloupe. They both confirm the decision to pursue full comfort care and not pursue antibiotics or treatment of bacteremia. They understand and say that her QOL has decreased since her husband's death (~1.5 yrs ago) overall and significantly since her hip fracture/repair in August.   We discussed what to expect at hospice and EOL as far as prognosis and symptom management. Discussed specifics for full comfort care. Anticipate that we will see further decline over the next few days with increased encephalopathy with untreated bacteremia, decline in appetite and intake, as well as possible dyspnea/fever/agitation at EOL. Emotional support provided. All questions/concerns addressed.   Primary Decision Maker HCPOA daughter Haynes Dage    SUMMARY OF RECOMMENDATIONS   Full comfort care Prognosis days to a week likely Transition to hospice facility  Code Status/Advance Care Planning:  DNR   Symptom Management:   Medications added to ensure comfort: prn morphine, lorazepam, robinul.   Palliative Prophylaxis:   Aspiration, Bowel Regimen, Delirium Protocol, Frequent Pain  Assessment and Palliative Wound Care  Additional Recommendations (Limitations, Scope, Preferences):  Full Comfort Care  Psycho-social/Spiritual:   Desire for further Chaplaincy support:no  Additional Recommendations: Education on Hospice  Prognosis:   < 2 weeks  Discharge Planning: Hospice facility      Primary Diagnoses: Present on Admission: . Sepsis ([place])   I have reviewed the medical record, interviewed the patient and family, and examined the patient. The following aspects are pertinent.  Past Medical History:  Diagnosis Date  . Coronary artery disease    s/p MI  . Dementia   . Depression   . Diabetes mellitus without complication ([place])    secondary to gallstone pancreatitis and subsequent pancreatitc destruction  . Hyperlipidemia   . Peripheral vascular disease [facility])    Social History   Social History  . Marital status: Widowed    Spouse name: N/A  . Number of children: N/A  . Years of education: N/A   Social History Main Topics  . Smoking status: Never Smoker  . Smokeless tobacco: Never Used  . Alcohol use No  . Drug use: No  . Sexual activity: Not Asked   Other Topics Concern  . None   Social History Narrative  . None   Family History  Problem Relation Age of Onset  . Colon cancer Mother   .  Esophageal varices Father    Scheduled Meds: . Chlorhexidine Gluconate Cloth  6 each Topical Q0600  . mouth rinse  15 mL Mouth Rinse BID   Continuous Infusions: . sodium chloride 100 mL/hr at 11/26/15 1301   PRN Meds:.acetaminophen **OR** acetaminophen, glycopyrrolate, LORazepam, morphine injection, polyvinyl alcohol Allergies  Allergen Reactions  . Sulfa Antibiotics Hives   Review of Systems  Unable to perform ROS: Mental status change    Physical Exam  Constitutional: She appears well-developed.  HENT:  Head: Normocephalic and atraumatic.  Cardiovascular: Normal rate.   Pulmonary/Chest: Effort normal. No accessory muscle usage. No  apnea and no tachypnea. No respiratory distress.  Abdominal: Soft. Normal appearance.  Neurological: She is unresponsive.  Nursing note and vitals reviewed.   Vital Signs: BP (!) 100/55 (BP Location: Right Arm)   Pulse 95   Temp 98.7 F (37.1 C) (Axillary)   Resp (!) 26   Ht 5' 4"  (1.626 m)   Wt 59 kg (130 lb)   SpO2 96%   BMI 22.31 kg/m  Pain Assessment: No/denies pain       SpO2: SpO2: 96 % O2 Device:SpO2: 96 % O2 Flow Rate: .O2 Flow Rate (L/min): 2 L/min  IO: Intake/output summary:  Intake/Output Summary (Last 24 hours) at 11/27/15 0857 Last data filed at 11/26/15 1830  Gross per 24 hour  Intake          1363.74 ml  Output                0 ml  Net          1363.74 ml    LBM: Last BM Date: 11/26/15 Baseline Weight: Weight: 59 kg (130 lb) Most recent weight: Weight: 59 kg (130 lb)     Palliative Assessment/Data: PPS: 20%     Time In: 0820 Time Out: 0900 Time Total: 51mn Greater than 50%  of this time was spent counseling and coordinating care related to the above assessment and plan.  Signed by: AVinie Sill NP Palliative Medicine Team Pager # [phone](M-F 8a-5p) Team Phone # [phone](Nights/Weekends)

## 2015-11-28 LAB — CULTURE, BLOOD (ROUTINE X 2)

## 2015-11-29 LAB — CULTURE, BLOOD (ROUTINE X 2)

## 2015-12-28 DEATH — deceased

## 2017-01-12 IMAGING — DX DG FEMUR 2+V PORT*L*
4 series · 4 of 4 positions shown · non-contrast
Comparison: None.

CLINICAL DATA: Postop IM nail

EXAM:
LEFT FEMUR PORTABLE 2 VIEWS

[hip ap (1 of 2)]
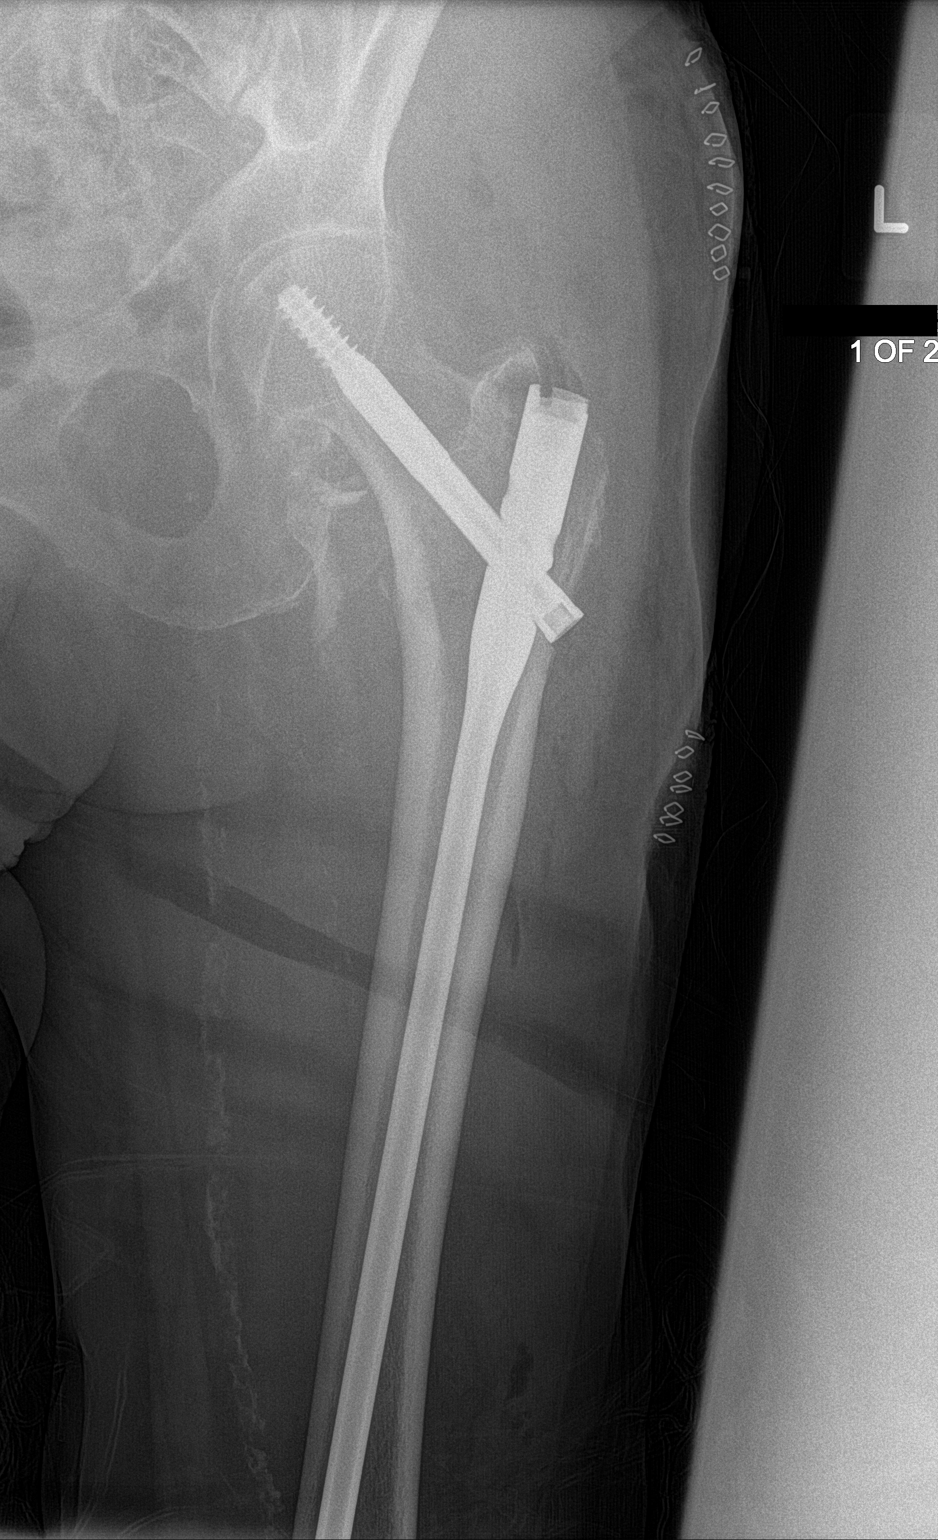

[hip lat (1 of 2)]
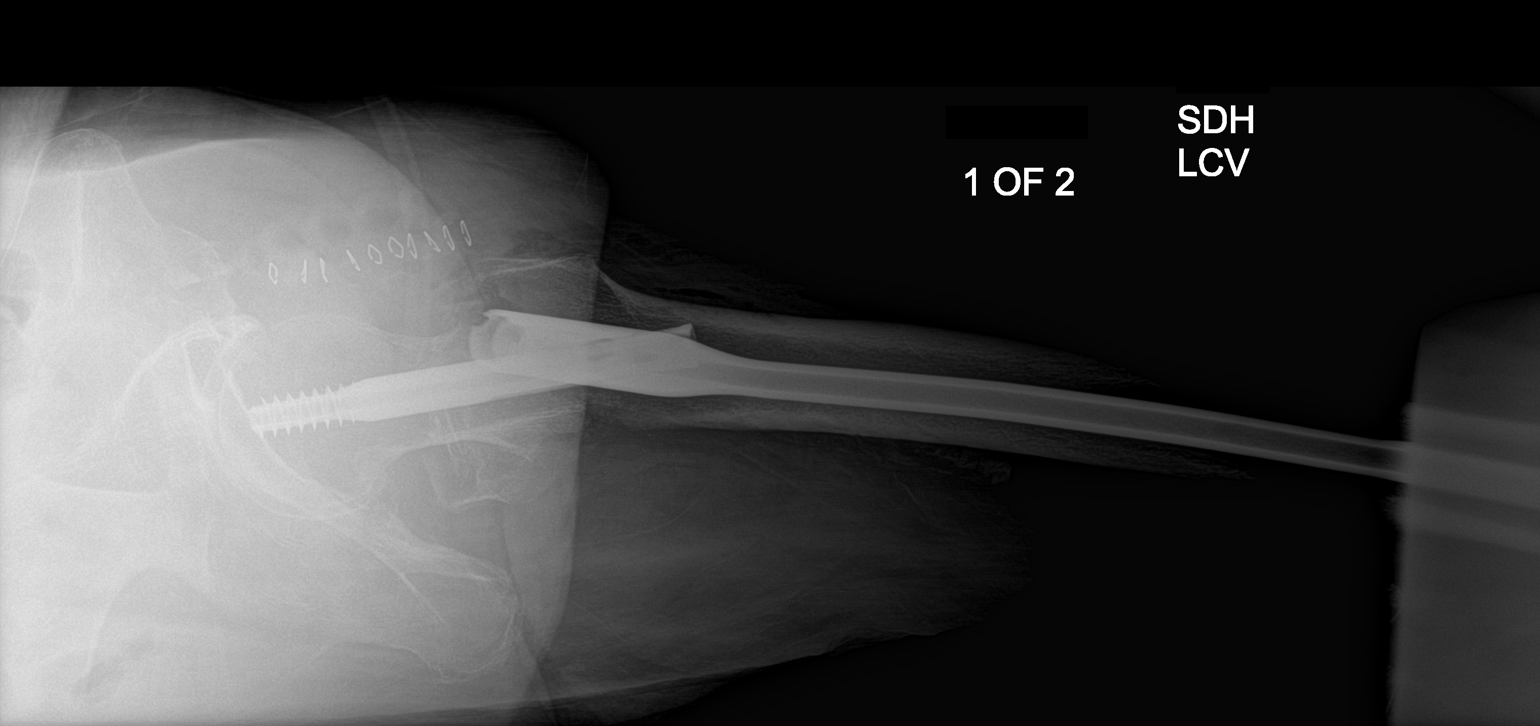

[hip ap (2 of 2)]
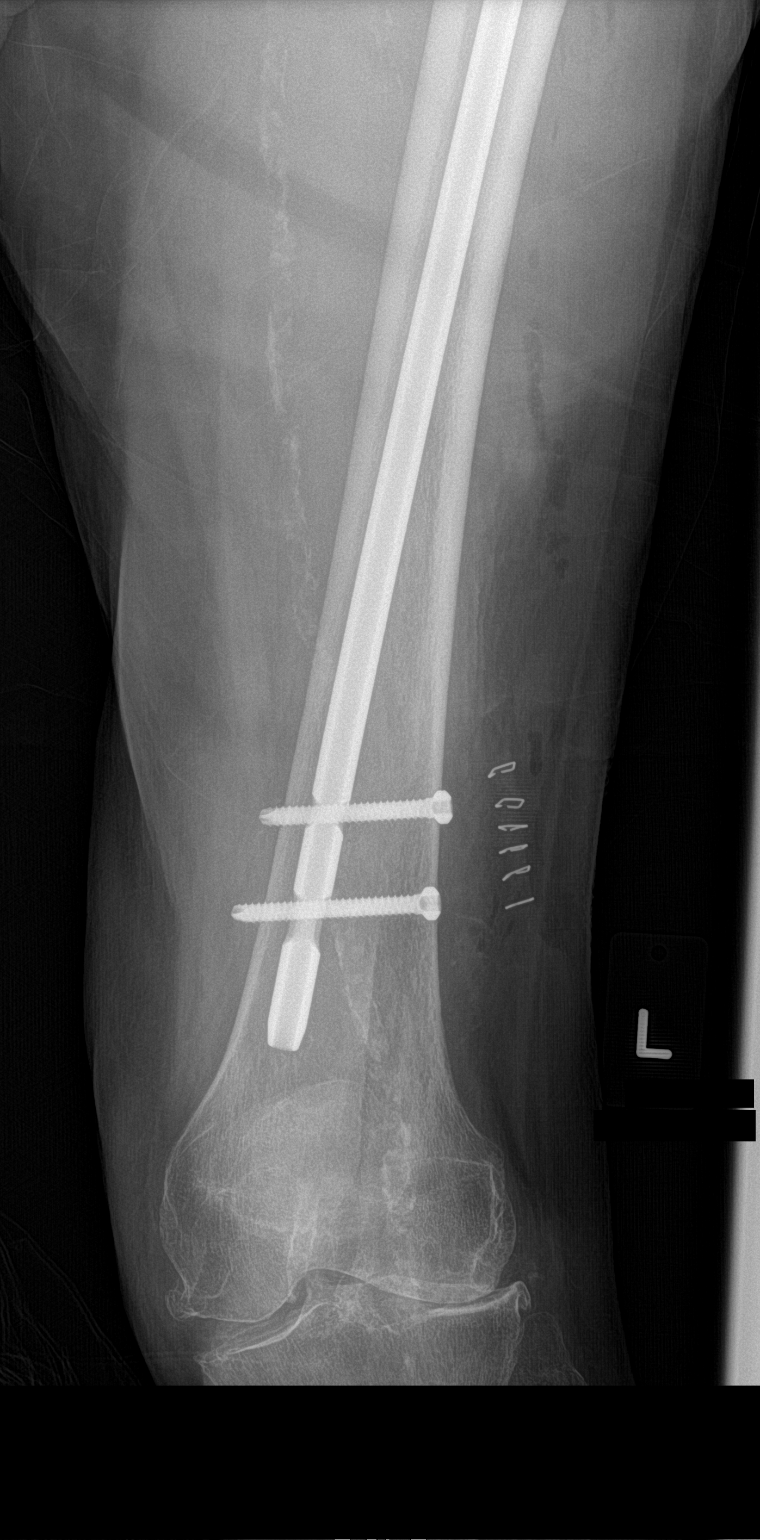

[hip lat (2 of 2)]
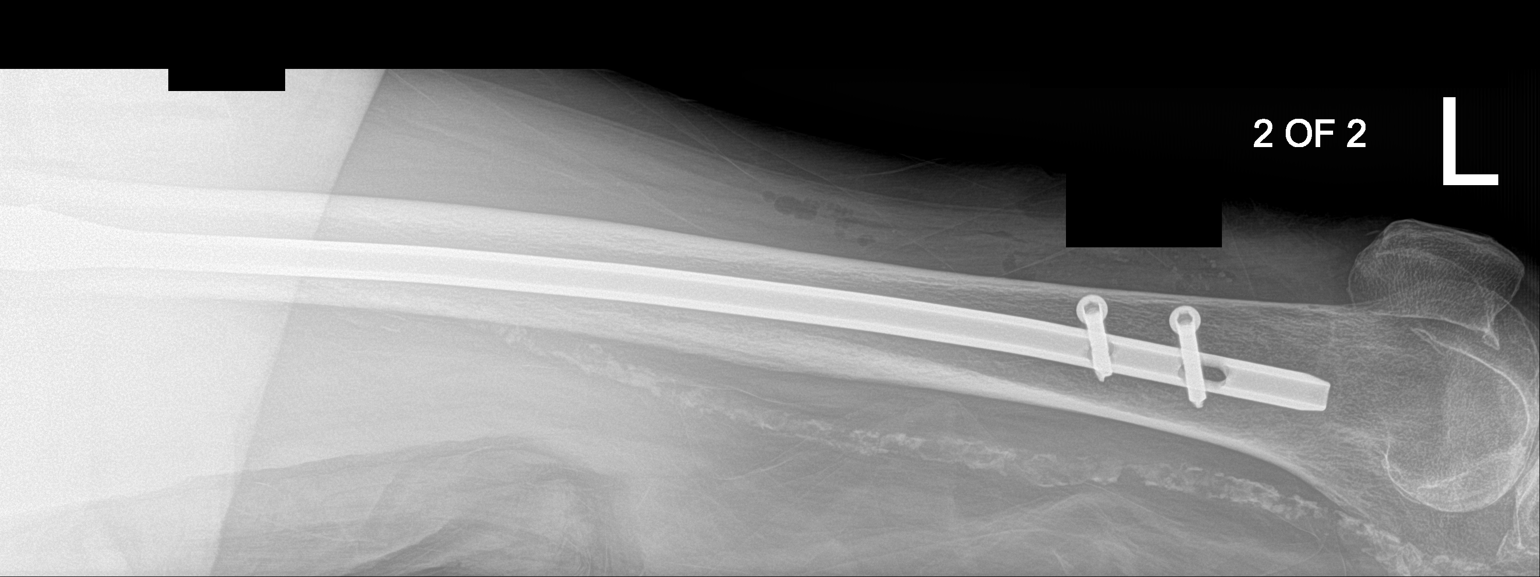

[4 of 4 positions shown; findings below may reference images not displayed]

FINDINGS: Placement of intra medullary nail and at and hip screw across the
left intertrochanteric fracture. Two locking screws. No hardware
complicating feature. Lesser trochanter remains displaced.
IMPRESSION: Internal fixation across the left intertrochanteric fracture. No
complicating feature.

## 2017-03-27 IMAGING — DX DG FOOT COMPLETE 3+V*L*
3 series · 3 of 3 positions shown · non-contrast
Comparison: Pelvis and left hip 09/11/2015

CLINICAL DATA: Laceration on top of the foot are around the second
and third digits.

EXAM:
LEFT FOOT - COMPLETE 3+ VIEW

[foot lat]
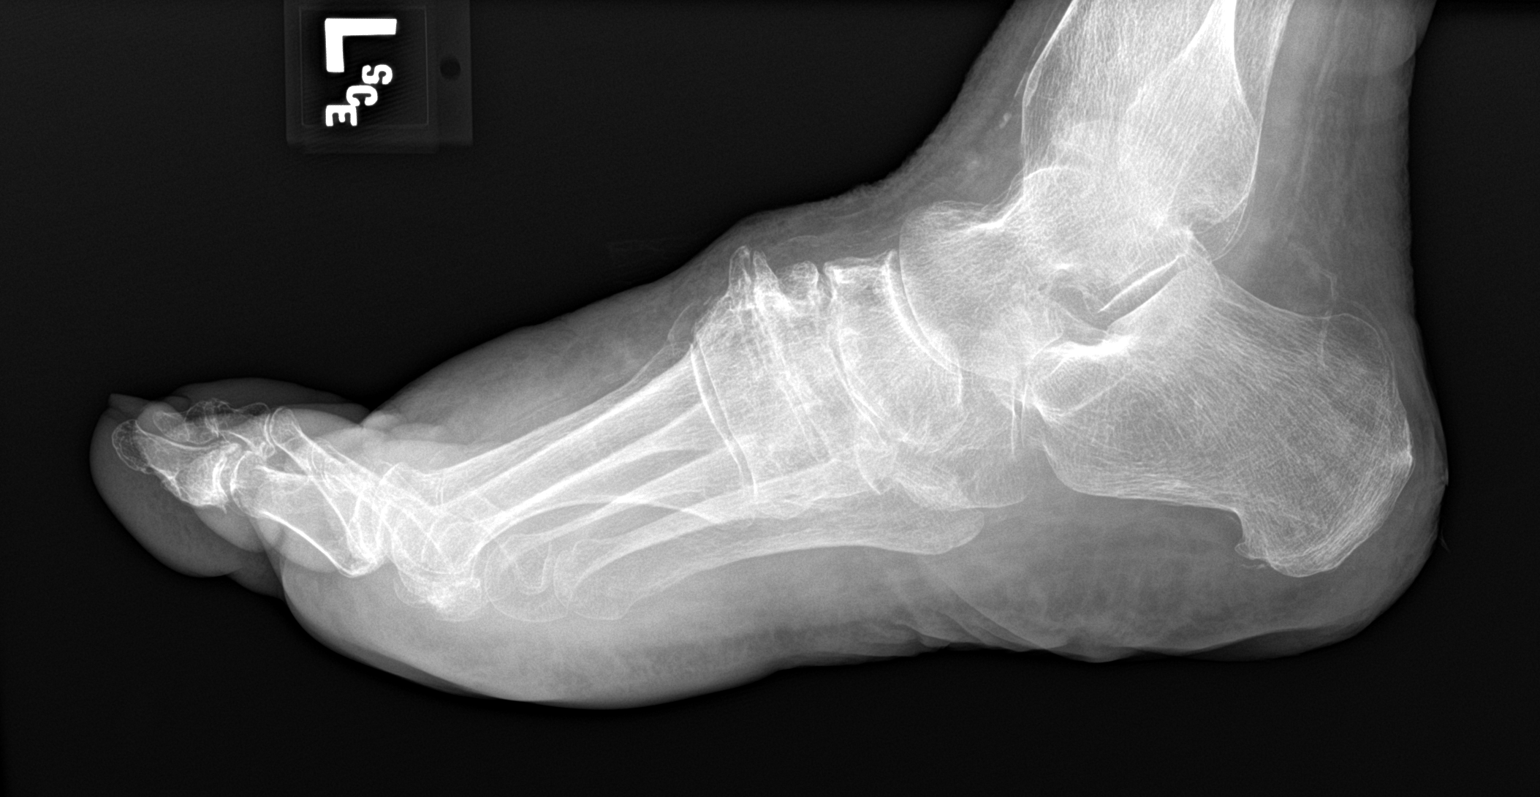

[foot obl]
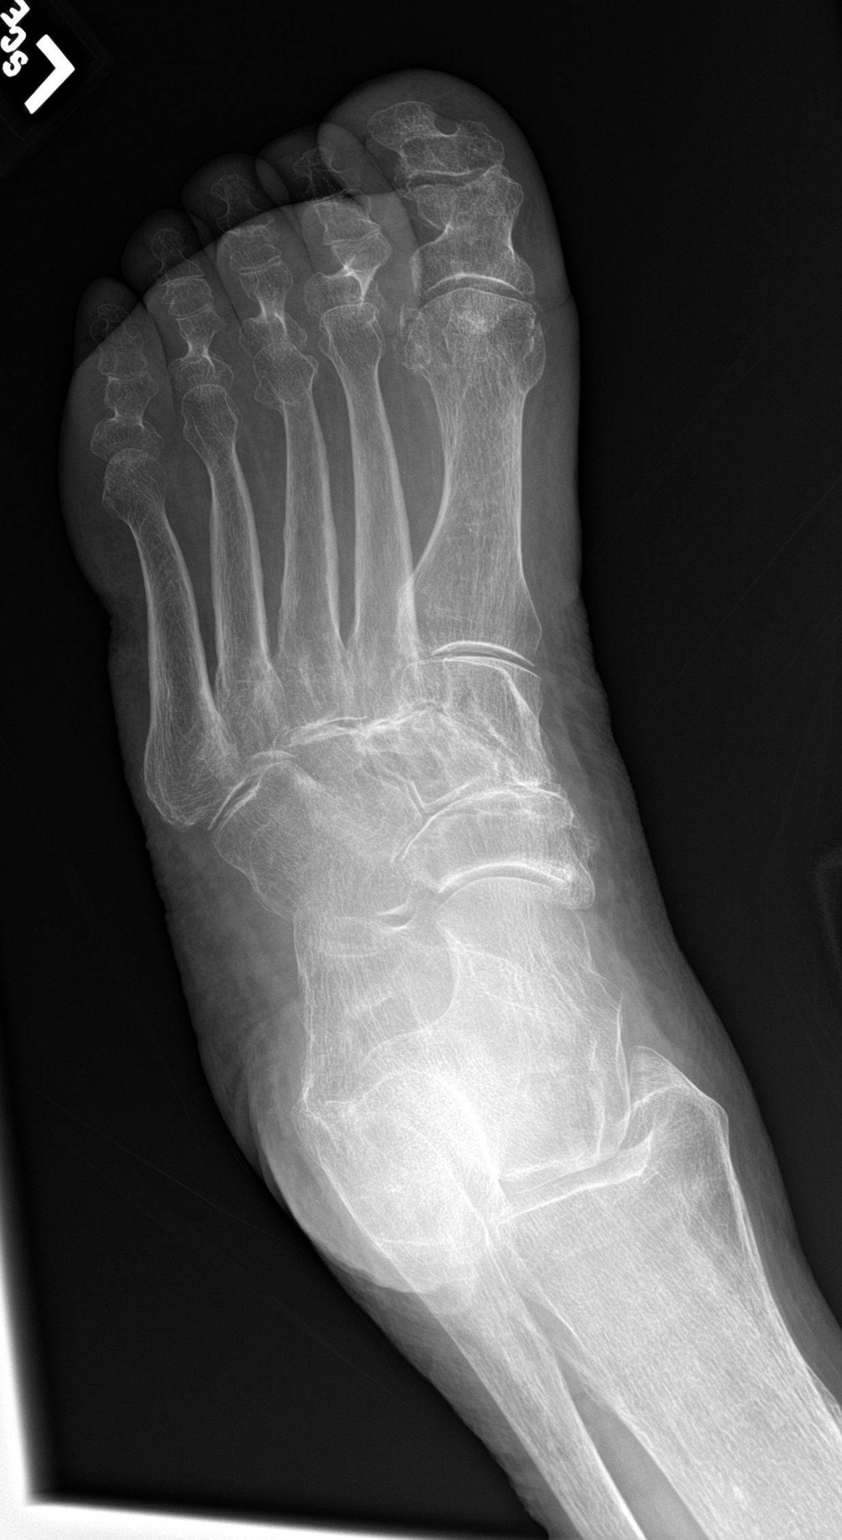

[foot ap]
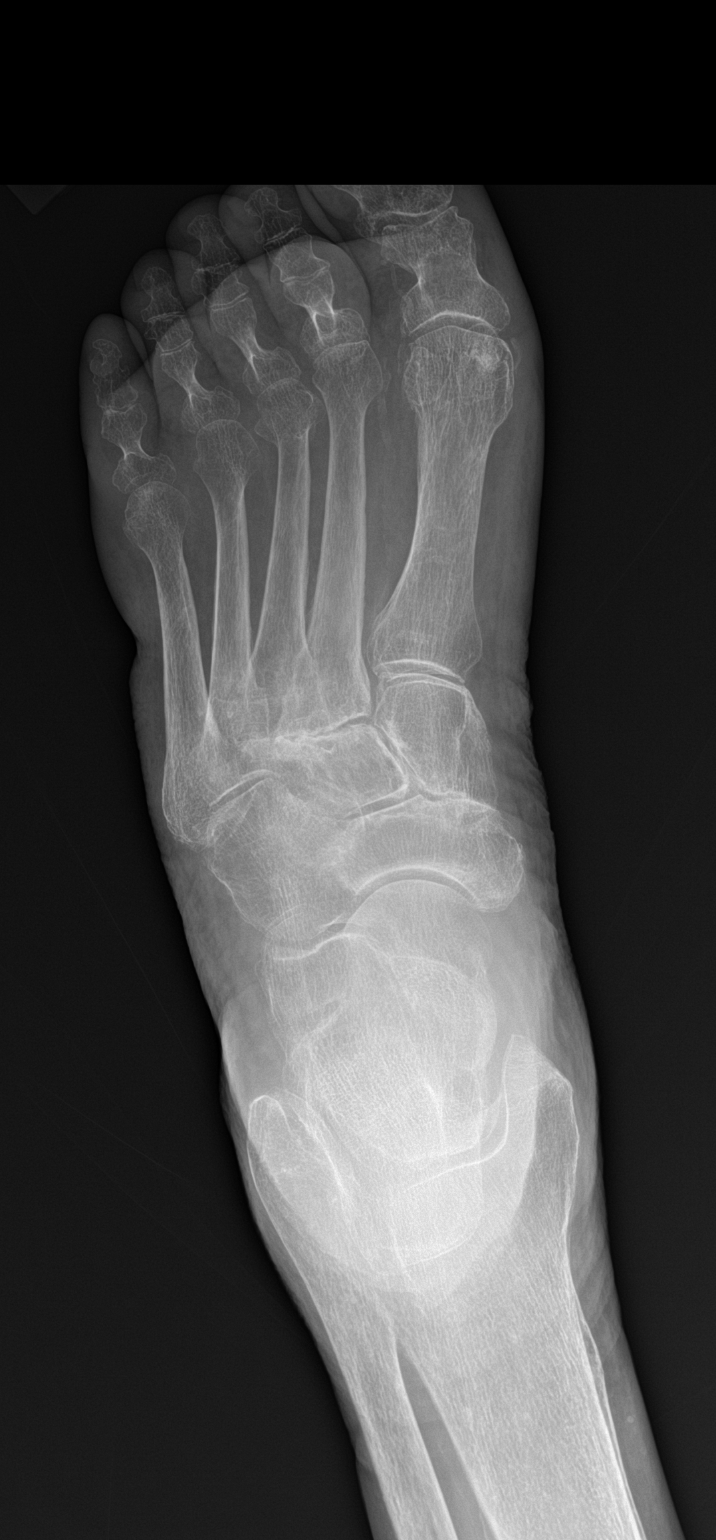

[3 of 3 positions shown; findings below may reference images not displayed]

FINDINGS: The bones are diffusely osteopenic. Atherosclerotic vascular
calcifications noted. There is soft tissue swelling over the dorsum
of the left at the level of the metatarsals. No soft tissue gas or
foreign body is identified.
IMPRESSION: Diffuse osteopenia.  No acute fracture identified.

Soft tissue swelling over the dorsum of the foot. No radiopaque
foreign body or soft tissue gas.
